# Patient Record
Sex: Female | Born: 1995 | Race: White | Hispanic: No | Marital: Married | Smoking: Never smoker
Health system: Southern US, Community
[De-identification: ages and names within clinical notes are randomized; demographics above are authoritative.]

## PROBLEM LIST (undated history)

## (undated) DIAGNOSIS — E079 Disorder of thyroid, unspecified: Secondary | ICD-10-CM

## (undated) DIAGNOSIS — F419 Anxiety disorder, unspecified: Secondary | ICD-10-CM

## (undated) DIAGNOSIS — M26639 Articular disc disorder of temporomandibular joint, unspecified side: Secondary | ICD-10-CM

## (undated) DIAGNOSIS — J343 Hypertrophy of nasal turbinates: Secondary | ICD-10-CM

## (undated) DIAGNOSIS — F32A Depression, unspecified: Secondary | ICD-10-CM

## (undated) DIAGNOSIS — F329 Major depressive disorder, single episode, unspecified: Secondary | ICD-10-CM

## (undated) DIAGNOSIS — T8859XA Other complications of anesthesia, initial encounter: Secondary | ICD-10-CM

## (undated) DIAGNOSIS — D6851 Activated protein C resistance: Secondary | ICD-10-CM

## (undated) DIAGNOSIS — K219 Gastro-esophageal reflux disease without esophagitis: Secondary | ICD-10-CM

## (undated) DIAGNOSIS — F909 Attention-deficit hyperactivity disorder, unspecified type: Secondary | ICD-10-CM

## (undated) HISTORY — DX: Depression, unspecified: F32.A

## (undated) HISTORY — DX: Anxiety disorder, unspecified: F41.9

## (undated) HISTORY — DX: Disorder of thyroid, unspecified: E07.9

## (undated) HISTORY — DX: Activated protein C resistance: D68.51

---
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
## 2015-07-27 DIAGNOSIS — F4322 Adjustment disorder with anxiety: Secondary | ICD-10-CM | POA: Insufficient documentation

## 2015-08-24 DIAGNOSIS — R41840 Attention and concentration deficit: Secondary | ICD-10-CM | POA: Insufficient documentation

## 2015-12-13 DIAGNOSIS — M26639 Articular disc disorder of temporomandibular joint, unspecified side: Secondary | ICD-10-CM

## 2015-12-13 HISTORY — DX: Articular disc disorder of temporomandibular joint, unspecified side: M26.639

## 2016-09-12 DIAGNOSIS — E038 Other specified hypothyroidism: Secondary | ICD-10-CM | POA: Insufficient documentation

## 2016-09-12 DIAGNOSIS — E039 Hypothyroidism, unspecified: Secondary | ICD-10-CM | POA: Insufficient documentation

## 2016-12-06 DIAGNOSIS — F419 Anxiety disorder, unspecified: Secondary | ICD-10-CM | POA: Insufficient documentation

## 2016-12-31 DIAGNOSIS — E559 Vitamin D deficiency, unspecified: Secondary | ICD-10-CM | POA: Insufficient documentation

## 2016-12-31 DIAGNOSIS — E568 Deficiency of other vitamins: Secondary | ICD-10-CM | POA: Insufficient documentation

## 2018-11-17 ENCOUNTER — Ambulatory Visit: Payer: Self-pay | Admitting: Family Medicine

## 2018-12-01 ENCOUNTER — Other Ambulatory Visit: Payer: Self-pay

## 2018-12-01 ENCOUNTER — Encounter: Payer: Self-pay | Admitting: Family Medicine

## 2018-12-01 ENCOUNTER — Ambulatory Visit: Payer: BC Managed Care – PPO | Admitting: Family Medicine

## 2018-12-01 VITALS — BP 112/62 | HR 86 | Temp 97.9°F | Resp 14 | Ht 68.0 in | Wt 210.4 lb

## 2018-12-01 DIAGNOSIS — Z130 Encounter for screening for diseases of the blood and blood-forming organs and certain disorders involving the immune mechanism: Secondary | ICD-10-CM

## 2018-12-01 DIAGNOSIS — F419 Anxiety disorder, unspecified: Secondary | ICD-10-CM

## 2018-12-01 DIAGNOSIS — E039 Hypothyroidism, unspecified: Secondary | ICD-10-CM | POA: Diagnosis not present

## 2018-12-01 DIAGNOSIS — Z1329 Encounter for screening for other suspected endocrine disorder: Secondary | ICD-10-CM

## 2018-12-01 DIAGNOSIS — F330 Major depressive disorder, recurrent, mild: Secondary | ICD-10-CM | POA: Insufficient documentation

## 2018-12-01 DIAGNOSIS — R5383 Other fatigue: Secondary | ICD-10-CM

## 2018-12-01 DIAGNOSIS — F331 Major depressive disorder, recurrent, moderate: Secondary | ICD-10-CM | POA: Diagnosis not present

## 2018-12-01 DIAGNOSIS — Z13228 Encounter for screening for other metabolic disorders: Secondary | ICD-10-CM

## 2018-12-01 DIAGNOSIS — Z7689 Persons encountering health services in other specified circumstances: Secondary | ICD-10-CM

## 2018-12-01 DIAGNOSIS — E669 Obesity, unspecified: Secondary | ICD-10-CM | POA: Insufficient documentation

## 2018-12-01 DIAGNOSIS — Z6831 Body mass index (BMI) 31.0-31.9, adult: Secondary | ICD-10-CM | POA: Insufficient documentation

## 2018-12-01 DIAGNOSIS — Z13220 Encounter for screening for lipoid disorders: Secondary | ICD-10-CM

## 2018-12-01 MED ORDER — BUSPIRONE HCL 5 MG PO TABS: 5.0000 mg | ORAL_TABLET | Freq: Two times a day (BID) | ORAL | 0 refills | Status: DC

## 2018-12-01 NOTE — Progress Notes (Deleted)
Name: Yvonne Little   MRN: [id number]    DOB: 1995-07-24   Date:12/01/2018       Progress Note  Subjective:    Chief Complaint  Chief Complaint  Patient presents with  . Establish Care    labs    I connected with  [name]  on 12/01/18 at 10:00 AM EDT by a video enabled telemedicine application and verified that I am speaking with the correct person using two identifiers.  I discussed the limitations of evaluation and management by telemedicine and the availability of in person appointments. The patient expressed understanding and agreed to proceed. Staff also discussed with the patient that there may be a patient responsible charge related to this service. Patient Location: *** Provider Location: *** Additional Individuals present: ***  HPI    There are no active problems to display for this patient.   History reviewed. No pertinent surgical history.  Family History  Problem Relation Age of Onset  . Hyperlipidemia Mother   . Hypertension Maternal Grandmother   . Hyperlipidemia Maternal Grandmother   . Hypertension Maternal Grandfather   . Hyperlipidemia Maternal Grandfather   . Stroke Maternal Grandfather   . Diabetes Paternal Grandmother   . Non-Hodgkin's lymphoma Paternal Grandmother   . Melanoma Paternal Grandfather     Social History   Socioeconomic History  . Marital status: Significant Other    Spouse name: Not on file  . Number of children: Not on file  . Years of education: 69  . Highest education level: Bachelor's degree (e.g., BA, AB, BS)  Occupational History  . Not on file  Social Needs  . Financial resource strain: Not hard at all  . Food insecurity    Worry: Never true    Inability: Never true  . Transportation needs    Medical: No    Non-medical: No  Tobacco Use  . Smoking status: Never Smoker  . Smokeless tobacco: Never Used  Substance and Sexual Activity  . Alcohol use: Yes  . Drug use: Never  . Sexual activity: Yes   Lifestyle  . Physical activity    Days per week: 0 days    Minutes per session: 0 min  . Stress: Very much  Relationships  . Social connections    Talks on phone: More than three times a week    Gets together: Once a week    Attends religious service: 1 to 4 times per year    Active member of club or organization: No    Attends meetings of clubs or organizations: Never    Relationship status: Not on file  . Intimate partner violence    Fear of current or ex partner: No    Emotionally abused: No    Physically abused: No    Forced sexual activity: No  Other Topics Concern  . Not on file  Social History Narrative  . Not on file     Current Outpatient Medications:  .  clonazePAM (KLONOPIN) 0.5 MG tablet, Take 0.5 mg by mouth as needed., Disp: , Rfl:  .  PARoxetine (PAXIL) 20 MG tablet, Take 20 mg by mouth daily., Disp: , Rfl:  .  phentermine (ADIPEX-P) 37.5 MG tablet, Take 37.5 mg by mouth daily., Disp: , Rfl:   Allergies  Allergen Reactions  . Escitalopram Other (See Comments)    Altered mental status.     I personally reviewed {Reviewed:14835} with the patient/caregiver today.  Review of Systems    Objective:  Virtual encounter, vitals limited, only able to obtain the following Today's Vitals   12/01/18 1006  BP: 112/62  Pulse: 86  Resp: 14  Temp: 97.9 F (36.6 C)  SpO2: 95%  Weight: 210 lb 6.4 oz (95.4 kg)  Height: 5\' 8"  (1.727 m)   Body mass index is 31.99 kg/m. Nursing Note and Vital Signs reviewed.  Physical Exam  PE limited by telephone encounter  No results found for this or any previous visit (from the past 72 hour(s)).  PHQ2/9: Depression screen PHQ 2/9 12/01/2018  Decreased Interest 0  Down, Depressed, Hopeless 1  PHQ - 2 Score 1  Altered sleeping 3  Tired, decreased energy 2  Change in appetite 1  Feeling bad or failure about yourself  3  Trouble concentrating 3  Moving slowly or fidgety/restless 0  Suicidal thoughts 0  PHQ-9  Score 13  Difficult doing work/chores Somewhat difficult   PHQ-2/9 Result is {gen negative/positive:315881}.    Fall Risk: Fall Risk  12/01/2018  Falls in the past year? 1  Number falls in past yr: 1  Injury with Fall? 1     Assessment and Plan:   Problem List Items Addressed This Visit    None       I discussed the assessment and treatment plan with the patient. The patient was provided an opportunity to ask questions and all were answered. The patient agreed with the plan and demonstrated an understanding of the instructions.  The patient was advised to call back or seek an in-person evaluation if the symptoms worsen or if the condition fails to improve as anticipated.  I provided *** minutes of non-face-to-face time during this encounter.  01/31/2019, PA-C 11/30/2008:34 AM

## 2018-12-01 NOTE — Progress Notes (Signed)
Name: Yvonne Little   MRN: [id number]    DOB: 1995/11/01   Date:12/01/2018       Progress Note  Chief Complaint  Patient presents with  . Establish Care    labs     Subjective:   Yvonne Little is a 23 y.o. female, presents to clinic for routine follow up on the conditions listed above.   Patient is a 23 year old female, she presents here with her mother, is new to establish care, recently moved here from [place] this past summer.  She was previously seeing Benjamine Sprague - [facility], [facility] -lasted a visit in September to get refills on her medications and has not had labs in a while.  Some records are available through care everywhere  Patient is primarily concerned with her mood, anxiety, depression, weight, history of hypothyroid and also reports history of ADHD.   Weight/Obesity BMI 31.99 -  she has been working on her diet and exercising and even though she hasn't lost weight recently her clothes are fitting better.  Her visit with her past PCP in September she did get phentermine prescribed to her but she has not yet filled it.  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  She did not have any side effects in the past with phentermine.  Her past PCP told her to start at 37.5 mg dose daily.  Patient states it also did help her focus little bit better with her ADHD while she is teaching.  She did not experience any adverse side effects in the past, she denies any chest pain, palpitations, dry mouth, elevated blood pressure, headaches, dry mouth. Goal weight is 170.    Anxiety and Depression: Patient is currently on Paxil 20 mg and has Klonopin for as needed use for panic attacks-  She states she does have a history of depression and anxiety She  reports passive SI in the past but denies any suicidal attempts or self-harm behavior in the past or presently.  She currently denies HI, SI and AVH.  She is having worse symptoms then which she normally have when she is on Paxil. She stopped medications for a little bit during the summer and she felt good but with increased stress with teaching she does need the medications again.  They do not seem like they are working as well as they used to in the past.  Her stress and anxiety is not only triggered by her demands responsibilities as a Pharmacist, hospital but when she started going to college or other job that she held she always has her depression anxiety triggered by any responsibilities.   She does have associated Insomnia - trouble getting to sleep at night -on a weekday or nights before school days she is getting 5 hours of sleep is great for her typically will get a little bit less, this makes it difficult to get through the day causing her worse attention, more anxious, and she does tend to catch up on sleep on the weekends sleeping late into the afternoons   Paxil 20 feels like its not working that well, 40 mg in the past was too sedating Lexapro - "severe reaction to that" was "out of it for a month" couldn't hardly get out of  bed -she was just told that it was a medication allergy and that was all that she was told she has not tried any other SSRIs or SNRIs When discussing her Klonopin medication she states that her family has history of addiction on both sides of the family, and she has a very "addictive personality" and she is very nervous about things that she could become addicted to easily or abuse easily.  She does not currently have any substance use or alcohol abuse.   Her PHQ and gad 7 are very high today we discussed this briefly, her mother was in the room the entire time and did not step out  Depression screen [facility] 2/9 12/01/2018  Decreased Interest 0  Down, Depressed, Hopeless 1  PHQ - 2  Score 1  Altered sleeping 3  Tired, decreased energy 2  Change in appetite 1  Feeling bad or failure about yourself  3  Trouble concentrating 3  Moving slowly or fidgety/restless 0  Suicidal thoughts 0  PHQ-9 Score 13  Difficult doing work/chores Somewhat difficult   PHQ9 and GAD7 positive and reviewed today with pt   GAD 7 : Generalized Anxiety Score 12/01/2018  Nervous, Anxious, on Edge 2  Control/stop worrying 2  Worry too much - different things 2  Trouble relaxing 3  Restless 1  Easily annoyed or irritable 3  Afraid - awful might happen 2  Total GAD 7 Score 15  Anxiety Difficulty Not difficult at all   Hx of hypothyroid in the past "for 6 months", was on meds but the patient says that it "fixed itself" she would like her thyroid levels checked again.  She is much more fatigue with worsening mood, increasing weight Current symptoms: change in energy level, heat / cold intolerance and weight changes . Patient denies palpitations. Symptoms have waxed and waned.  Some intermittent bowel changes, sometimes constipated sometimes loser.   Patient Active Problem List   Diagnosis Date Noted  . Mild episode of recurrent major depressive disorder (HCC) 12/01/2018  . Anxiety disorder 12/01/2018   History reviewed. No pertinent surgical history.  Family History  Problem Relation Age of Onset  . Hyperlipidemia Mother   . Hypertension Maternal Grandmother   . Hyperlipidemia Maternal Grandmother   . Hypertension Maternal Grandfather   . Hyperlipidemia Maternal Grandfather   . Stroke Maternal Grandfather   . Diabetes Paternal Grandmother   . Non-Hodgkin's lymphoma Paternal Grandmother   . Melanoma Paternal Grandfather     Social History   Socioeconomic History  . Marital status: Significant Other    Spouse name: Not on file  . Number of children: Not on file  . Years of education: 64  . Highest education level: Bachelor's degree (e.g., BA, AB, BS)  Occupational History  .  Not on file  Social Needs  . Financial resource strain: Not hard at all  . Food insecurity    Worry: Never true    Inability: Never true  . Transportation needs    Medical: No    Non-medical: No  Tobacco Use  . Smoking status: Never Smoker  . Smokeless tobacco: Never Used  Substance and Sexual Activity  . Alcohol use: Yes  . Drug use: Never  . Sexual activity: Yes  Lifestyle  . Physical activity    Days per week: 0 days    Minutes per session: 0 min  . Stress: Very much  Relationships  . Social connections    Talks on phone: More than three times  a week    Gets together: Once a week    Attends religious service: 1 to 4 times per year    Active member of club or organization: No    Attends meetings of clubs or organizations: Never    Relationship status: Not on file  . Intimate partner violence    Fear of current or ex partner: No    Emotionally abused: No    Physically abused: No    Forced sexual activity: No  Other Topics Concern  . Not on file  Social History Narrative  . Not on file     Current Outpatient Medications:  .  clonazePAM (KLONOPIN) 0.5 MG tablet, Take 0.5 mg by mouth as needed., Disp: , Rfl:  .  PARoxetine (PAXIL) 20 MG tablet, Take 20 mg by mouth daily., Disp: , Rfl:  .  phentermine (ADIPEX-P) 37.5 MG tablet, Take 37.5 mg by mouth daily., Disp: , Rfl:  .  busPIRone (BUSPAR) 5 MG tablet, Take 1 tablet (5 mg total) by mouth 2 (two) times daily., Disp: 60 tablet, Rfl: 0  Allergies  Allergen Reactions  . Escitalopram Other (See Comments)    Altered mental status.    I personally reviewed active problem list, medication list, allergies, family history, social history, health maintenance, notes from last encounter, lab results with the patient/caregiver today.  Review of Systems  Constitutional: Positive for fatigue. Negative for activity change, appetite change, chills, diaphoresis and fever.  HENT: Negative.   Eyes: Negative.   Respiratory:  Negative.  Negative for shortness of breath.   Cardiovascular: Negative.  Negative for chest pain, palpitations and leg swelling.  Gastrointestinal: Negative.  Negative for abdominal pain and blood in stool.  Endocrine: Negative.   Genitourinary: Negative.   Musculoskeletal: Negative.  Negative for arthralgias, gait problem, joint swelling and myalgias.  Skin: Negative.  Negative for color change, pallor and rash.  Allergic/Immunologic: Negative.   Neurological: Negative.  Negative for dizziness, syncope, weakness, light-headedness, numbness and headaches.  Hematological: Negative.   Psychiatric/Behavioral: Positive for agitation, decreased concentration, dysphoric mood and sleep disturbance. Negative for behavioral problems, confusion, self-injury and suicidal ideas. The patient is nervous/anxious.      Objective:    Vitals:   12/01/18 1006  BP: 112/62  Pulse: 86  Resp: 14  Temp: 97.9 F (36.6 C)  SpO2: 95%  Weight: 210 lb 6.4 oz (95.4 kg)  Height: 5\' 8"  (1.727 m)    Body mass index is 31.99 kg/m.  Physical Exam Constitutional:      General: She is not in acute distress.    Appearance: Normal appearance. She is well-developed. She is not toxic-appearing or diaphoretic.  HENT:     Head: Normocephalic and atraumatic.     Right Ear: External ear normal.     Left Ear: External ear normal.     Nose: Nose normal.     Mouth/Throat:     Pharynx: Uvula midline.  Eyes:     General: Lids are normal.     Conjunctiva/sclera: Conjunctivae normal.     Pupils: Pupils are equal, round, and reactive to light.  Neck:     Musculoskeletal: Normal range of motion and neck supple.     Trachea: Phonation normal. No tracheal deviation.  Cardiovascular:     Rate and Rhythm: Normal rate and regular rhythm.     Pulses: Normal pulses.          Radial pulses are 2+ on the right side and 2+ on the  left side.       Posterior tibial pulses are 2+ on the right side and 2+ on the left side.      Heart sounds: Normal heart sounds. No murmur. No friction rub. No gallop.   Pulmonary:     Effort: Pulmonary effort is normal. No respiratory distress.     Breath sounds: Normal breath sounds. No stridor. No wheezing, rhonchi or rales.  Chest:     Chest wall: No tenderness.  Abdominal:     General: Bowel sounds are normal. There is no distension.     Palpations: Abdomen is soft.     Tenderness: There is no abdominal tenderness. There is no guarding or rebound.  Musculoskeletal: Normal range of motion.        General: No deformity.  Lymphadenopathy:     Cervical: No cervical adenopathy.  Skin:    General: Skin is warm and dry.     Capillary Refill: Capillary refill takes less than 2 seconds.     Coloration: Skin is not pale.     Findings: No rash.  Neurological:     Mental Status: She is alert and oriented to person, place, and time.     Motor: No abnormal muscle tone.     Gait: Gait normal.  Psychiatric:        Speech: Speech normal.        Behavior: Behavior normal.      Fall Risk: Fall Risk  12/01/2018  Falls in the past year? 1  Number falls in past yr: 1  Injury with Fall? 1    Functional Status Survey: Is the patient deaf or have difficulty hearing?: No Does the patient have difficulty seeing, even when wearing glasses/contacts?: Yes Does the patient have difficulty concentrating, remembering, or making decisions?: No Does the patient have difficulty walking or climbing stairs?: No Does the patient have difficulty dressing or bathing?: No Does the patient have difficulty doing errands alone such as visiting a doctor's office or shopping?: No  Assessment & Plan:   New pt to est care here today, Suzanna ObeyGabrielle Winters is a 23 y.o. caucasian female, otherwise healthy with med hx of obesity, anxiety & depression.    ICD-10-CM   1. Moderate episode of recurrent major depressive disorder (HCC)  F33.1 PARoxetine (PAXIL) 20 MG tablet    busPIRone (BUSPAR) 5 MG tablet   very  sx on current med managment.  Add Buspar and if no improvement would cross taper - therapy/counseling would be helpful  2. Anxiety disorder, unspecified type  F41.9 clonazePAM (KLONOPIN) 0.5 MG tablet    PARoxetine (PAXIL) 20 MG tablet    busPIRone (BUSPAR) 5 MG tablet   see above, discussed avoiding controlled substances/benzos with her concerns of family hx of addiction, discussed sparingly use for severe sx or with med chgs   3. Fatigue, unspecified type  R53.83 Hemoglobin A1c    TSH    CBC with Differential/Platelet    T4, free   r/o hypothyroid, anemia, suspect mostly related to psychiatric conditions and sx  4. Hypothyroidism, unspecified type  E03.9 TSH    T4, free   unclear hx, labs today  5. Class 1 obesity with body mass index (BMI) of 31.0 to 31.9 in adult, unspecified obesity type, unspecified whether serious comorbidity present  E66.9 phentermine (ADIPEX-P) 37.5 MG tablet   Z68.31 COMPLETE METABOLIC PANEL WITH GFR    Lipid panel    Hemoglobin A1c    TSH   discussed healthy diet  and exercising to be healthier and feel better.  discussed at length today, handout given  6. Screening for lipoid disorders  Z13.220 Lipid panel  7. Screening for endocrine, metabolic and immunity disorder  Z13.29 COMPLETE METABOLIC PANEL WITH GFR   Z13.228 Lipid panel   Z13.0 Hemoglobin A1c    TSH    T4, free  8. Screening for deficiency anemia  Z13.0 CBC with Differential/Platelet  9. Encounter to establish care with new doctor  [phone]    records available through care everywhere for last PCP in [place]     Return in about 1 month (around 01/01/2019) for f/up med check phentermine and mood/depression/anxiety meds (virtual ok).   [name], PA-C 12/01/18 12:39 PM

## 2018-12-02 LAB — HEMOGLOBIN A1C
Hgb A1c MFr Bld: 5.1 % of total Hgb (ref <5.7)
Mean Plasma Glucose: 100 (calc)
eAG (mmol/L): 5.5 (calc)

## 2018-12-02 LAB — COMPLETE METABOLIC PANEL WITH GFR
AG Ratio: 1.8 (calc) (ref 1.0–2.5)
ALT: 11 U/L (ref 6–29)
AST: 15 U/L (ref 10–30)
Albumin: 4.3 g/dL (ref 3.6–5.1)
Alkaline phosphatase (APISO): 68 U/L (ref 31–125)
BUN: 11 mg/dL (ref 7–25)
CO2: 24 mmol/L (ref 20–32)
Calcium: 9.2 mg/dL (ref 8.6–10.2)
Chloride: 106 mmol/L (ref 98–110)
Creat: 0.8 mg/dL (ref 0.50–1.10)
GFR, Est African American: 120 mL/min/{1.73_m2} (ref >=60)
GFR, Est Non African American: 103 mL/min/{1.73_m2} (ref >=60)
Globulin: 2.4 g/dL (calc) (ref 1.9–3.7)
Glucose, Bld: 99 mg/dL (ref 65–99)
Potassium: 4.6 mmol/L (ref 3.5–5.3)
Sodium: 140 mmol/L (ref 135–146)
Total Bilirubin: 0.3 mg/dL (ref 0.2–1.2)
Total Protein: 6.7 g/dL (ref 6.1–8.1)

## 2018-12-02 LAB — CBC WITH DIFFERENTIAL/PLATELET
Absolute Monocytes: 488 cells/uL (ref 200–950)
Basophils Absolute: 59 cells/uL (ref 0–200)
Basophils Relative: 0.9 %
Eosinophils Absolute: 338 cells/uL (ref 15–500)
Eosinophils Relative: 5.2 %
HCT: 38.3 % (ref 35.0–45.0)
Hemoglobin: 12.8 g/dL (ref 11.7–15.5)
Lymphs Abs: 2535 cells/uL (ref 850–3900)
MCH: 30.6 pg (ref 27.0–33.0)
MCHC: 33.4 g/dL (ref 32.0–36.0)
MCV: 91.6 fL (ref 80.0–100.0)
MPV: 9.9 fL (ref 7.5–12.5)
Monocytes Relative: 7.5 %
Neutro Abs: 3081 cells/uL (ref 1500–7800)
Neutrophils Relative %: 47.4 %
Platelets: 250 10*3/uL (ref 140–400)
RBC: 4.18 10*6/uL (ref 3.80–5.10)
RDW: 13.1 % (ref 11.0–15.0)
Total Lymphocyte: 39 %
WBC: 6.5 10*3/uL (ref 3.8–10.8)

## 2018-12-02 LAB — LIPID PANEL
Cholesterol: 155 mg/dL (ref <200)
HDL: 44 mg/dL — ABNORMAL LOW (ref >=50)
LDL Cholesterol (Calc): 98 mg/dL (calc)
Non-HDL Cholesterol (Calc): 111 mg/dL (calc) (ref <130)
Total CHOL/HDL Ratio: 3.5 (calc) (ref <5.0)
Triglycerides: 51 mg/dL (ref <150)

## 2018-12-02 LAB — TSH: TSH: 6.29 mIU/L — ABNORMAL HIGH

## 2018-12-02 LAB — T4, FREE: Free T4: 0.9 ng/dL (ref 0.8–1.8)

## 2018-12-02 MED ORDER — LEVOTHYROXINE SODIUM 25 MCG PO TABS: ORAL_TABLET | ORAL | 0 refills | Status: DC

## 2018-12-02 NOTE — Addendum Note (Signed)
Addended by: Delsa Grana on: 12/02/2018 05:05 PM   Modules accepted: Orders

## 2018-12-16 ENCOUNTER — Other Ambulatory Visit: Payer: Self-pay | Admitting: Family Medicine

## 2018-12-16 NOTE — Telephone Encounter (Signed)
Can you all and see what dose the pt was on and tolerating so I can call in the refill med at that dose?  75 mcg? I may call in 75 or 100 mcg depending on how she was feeling.  thanks

## 2018-12-16 NOTE — Telephone Encounter (Signed)
Refill request for thyroid medication. Levothyroxine   Last visit 12/01/2018   Lab Results  Component Value Date   TSH 6.29 (H) 12/01/2018     Follow up on 01/02/2019

## 2018-12-16 NOTE — Telephone Encounter (Signed)
Left voice mail

## 2018-12-16 NOTE — Telephone Encounter (Signed)
Pt states doing good is supposed to start 75mg  tomorrow

## 2018-12-17 NOTE — Telephone Encounter (Signed)
Left detailed voicemail

## 2018-12-19 ENCOUNTER — Telehealth: Payer: Self-pay

## 2018-12-19 NOTE — Telephone Encounter (Signed)
Pt states is constantly hunger, fatigue, sleeping all the time she states its worse with the meds?  Please advise

## 2018-12-19 NOTE — Telephone Encounter (Signed)
Copied from [place] [phone]. Topic: General - Other >> Dec 18, 2018  4:05 PM Pauline Good wrote: Reason for CRM: pt need to speak to nurse because she feel like her thyroid medication isn't working. Please call pt

## 2018-12-22 NOTE — Telephone Encounter (Signed)
lvm for scheduling °

## 2018-12-24 ENCOUNTER — Other Ambulatory Visit: Payer: Self-pay

## 2018-12-24 NOTE — Telephone Encounter (Signed)
On 75mg  now

## 2018-12-25 ENCOUNTER — Other Ambulatory Visit: Payer: Self-pay

## 2018-12-25 ENCOUNTER — Encounter: Payer: Self-pay | Admitting: Family Medicine

## 2018-12-25 ENCOUNTER — Ambulatory Visit: Payer: BC Managed Care – PPO | Admitting: Family Medicine

## 2018-12-25 VITALS — BP 114/80 | HR 97 | Temp 97.5°F | Resp 14 | Wt 211.0 lb

## 2018-12-25 DIAGNOSIS — E039 Hypothyroidism, unspecified: Secondary | ICD-10-CM | POA: Diagnosis not present

## 2018-12-25 DIAGNOSIS — F419 Anxiety disorder, unspecified: Secondary | ICD-10-CM

## 2018-12-25 DIAGNOSIS — E669 Obesity, unspecified: Secondary | ICD-10-CM | POA: Diagnosis not present

## 2018-12-25 DIAGNOSIS — F330 Major depressive disorder, recurrent, mild: Secondary | ICD-10-CM | POA: Diagnosis not present

## 2018-12-25 DIAGNOSIS — G4700 Insomnia, unspecified: Secondary | ICD-10-CM

## 2018-12-25 DIAGNOSIS — Z6831 Body mass index (BMI) 31.0-31.9, adult: Secondary | ICD-10-CM

## 2018-12-25 MED ORDER — LEVOTHYROXINE SODIUM 100 MCG PO TABS: ORAL_TABLET | ORAL | 0 refills | Status: DC

## 2018-12-25 MED ORDER — ZOLPIDEM TARTRATE 5 MG PO TABS: 5.0000 mg | ORAL_TABLET | Freq: Every evening | ORAL | 1 refills | Status: DC | PRN

## 2018-12-25 NOTE — Patient Instructions (Signed)
Sleep Hygiene Tips  1) Get regular. One of the best ways to train your body to sleep well is to go to bed and get up at more or less the same time every day, even on weekends and days off! This regular rhythm will make you feel better and will give your body something to work from.  2) Sleep when sleepy. Only try to sleep when you actually feel tired or sleepy, rather than spending too much time awake in bed.  3) Get up & try again. If you haven't been able to get to sleep after about 20 minutes or more, get up and do something calming or boring until you feel sleepy, then return to bed and try again. Sit quietly on the couch with the lights off (bright light will tell your brain that it is time to wake up), or read something boring like the phone book. Avoid doing anything that is too stimulating or interesting, as this will wake you up even more.  4) Avoid caffeine & nicotine. It is best to avoid consuming any caffeine (in coffee, tea, cola drinks, chocolate, and some medications) or nicotine (cigarettes) for at least 4-6 hours before going to bed. These substances act as stimulants and interfere with the ability to fall asleep  5) Avoid alcohol. It is also best to avoid alcohol for at least 4-6 hours before going to bed. Many people believe that alcohol is relaxing and helps them to get to sleep at first, but it actually interrupts the quality of Sleep.  6) Bed is for sleeping. Try not to use your bed for anything other than sleeping and sex, so that your body comes to associate bed with sleep. If you use bed as a place to watch TV, eat, read, work on your laptop, pay bills, and other things, your body will not learn this Connection.  7) No naps. It is best to avoid taking naps during the day, to make sure that you are tired at bedtime. If you can't make it through the day without a nap, make sure it is for less than an hour and before 3pm.  8) Sleep rituals.  You can develop your own rituals of things to remind your body that it is time to sleep - some people find it useful to do relaxing stretches or breathing exercises for 15 minutes before bed each night, or sit calmly with a cup of caffeine-free tea.  9) Bathtime. Having a hot bath 1-2 hours before bedtime can be useful, as it will raise your body temperature, causing you to feel sleepy as your body temperature drops again. Research shows that sleepiness is associated with a drop in body temperature.  10) No clock-watching. Many people who struggle with sleep tend to watch the clock too much. Frequently checking the clock during the night can wake you up (especially if you turn on the light to read the time) and reinforces negative thoughts such as "Oh no, look how late it is, I'll never get to sleep" or "it's so early, I have only slept for 5 hours, this is Terrible.  11) Use a sleep diary. This worksheet can be a useful way of making sure you have the right facts about your sleep, rather than making assumptions. Because a diary involves watching the clock (see point 10) it is a good idea to only use it for two weeks to get an idea of what is going and then perhaps two months down the  track to see how you are progressing.  12) Exercise. Regular exercise is a good idea to help with good sleep, but try not to do strenuous exercise in the 4 hours before bedtime. Morning walks are a great way to start the day feeling refreshed!  13) Eat right. A healthy, balanced diet will help you to sleep well, but timing is important. Some people find that a very empty stomach at bedtime is distracting, so it can be useful to have a light snack, but a heavy meal soon before bed can also interrupt sleep. Some people recommend a warm glass of milk, which contains tryptophan, which acts as a natural sleep inducer.  14) The right space. It is very important that your bed and bedroom are quiet  and comfortable for sleeping. A cooler room with enough blankets to stay warm is best, and make sure you have curtains or an eyemask to block out early morning light and earplugs if there is noise outside your Room.  15) Keep daytime routine the same. Even if you have a bad night sleep and are tired it is important that you try to keep your daytime activities the same as you had planned. That is, don't avoid activities because you feel tired. This can reinforce the insomnia.

## 2018-12-25 NOTE — Progress Notes (Signed)
Name: Yvonne Little   MRN: [id number]    DOB: December 18, 1995   Date:12/25/2018       Progress Note  Chief Complaint  Patient presents with   Follow-up    1 month f/u   Hypothyroidism    does not think is working.  Has been on the for 1 weeks now, still feeling naueous, weight gain, and fatigue     Subjective:   Yvonne Little is a 23 y.o. female, presents to clinic for routine follow up on hypothyroid, fatigue, anxiety and depression-recently established care here after moving  Subclinical hypothyroid -patient had been diagnosed with hypothyroid about 2 to 3 years ago and has been on and off medications but prior to coming here few weeks ago she was feeling similar symptoms to when she was hypothyroid before and did desire to restart medications.  We did check her TSH and it was elevated, T4 was normal.  Because she is had this before and tolerated levothyroxine well in the past we did decide to restart medication starting 25 mcg and gradually increasing every week.  She is currently at a 75 mcg dose.  She feels that her appetite is up, she feels like she is gaining weight she feels more fatigued -the symptoms started to be worse shortly after beginning the medications again.  She denies any palpitations, racing heart, sweats. Reviewing her weight has been fairly stable Wt Readings from Last 5 Encounters:  12/25/18 211 lb (95.7 kg)  12/01/18 210 lb 6.4 oz (95.4 kg)   She feels like her moods her depression and anxiety are overall feeling better with her Zoloft but her energy her sleeping patterns her fatigue is much worse.  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  She is only waking up when she  has to for work and then starts the cycle all over again.  On the weekends she is sleeping in all day sometimes late into the afternoon and still feels tired.  In the evening she is mostly home alone she watch TV or do stuff around her house.  When she does try and go to bed she is not feeling anxious or having racing thoughts or anxiety symptoms preventing her from falling asleep.   We reviewed her depression and anxiety screening today during the visit  Depression screen [facility] 2/9 12/25/2018 12/01/2018  Decreased Interest 2 0  Down, Depressed, Hopeless 1 1  PHQ - 2 Score 3 1  Altered sleeping 3 3  Tired, decreased energy 3 2  Change in appetite 3 1  Feeling bad or failure about yourself  0 3  Trouble concentrating 3 3  Moving slowly or fidgety/restless 3 0  Suicidal thoughts 0 0  PHQ-9 Score 18 13  Difficult doing work/chores Extremely dIfficult Somewhat difficult  Overall her depressive symptoms have improved but her fatigue, altered sleeping appetite and energy are worse.  She has no suicidal thoughts and her depressive thoughts and feeling bad about herself or that she is a failure she does not have those right now. She states that she has been taking Paxil for about a year and she has taken it at night and sometimes  in the morning she does not notice a difference in it making her sleepy.  She was prescribed Klonopin by her last PCP but she has not been taking it and she does not like taking it.  GAD 7 : Generalized Anxiety Score 12/25/2018 12/01/2018  Nervous, Anxious, on Edge 1 2  Control/stop worrying 1 2  Worry too much - different things 1 2  Trouble relaxing 1 3  Restless 1 1  Easily annoyed or irritable 1 3  Afraid - awful might happen 1 2  Total GAD 7 Score 7 15  Anxiety Difficulty Not difficult at all Not difficult at all  We reviewed her anxiety symptoms they are much better than a few weeks ago.  She is concerned about her weight -since starting her levothyroxine again she  decided to hold the phentermine and has not taken it she is concerned that she feels hungrier and feels that her clothes are tighter but she has only 1 pound weight change since several weeks ago.    HPI from December 01, 2018 when she first came here to establish care Weight/Obesity BMI 31.99 -  she has been working on her diet and exercising and even though she hasn't lost weight recently her clothes are fitting better.  Her visit with her past PCP in September she did get phentermine prescribed to her but she has not yet filled it.  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  She did not have any side effects in the past with phentermine.  Her past PCP told her to start at 37.5 mg dose daily.  Patient states it also did help her focus little bit better with her ADHD while she is teaching.  She did not experience any adverse side effects in the past, she denies any chest pain, palpitations, dry mouth, elevated blood pressure, headaches, dry mouth. Goal weight is 170.    Anxiety and Depression: Patient is currently on Paxil 20 mg and has Klonopin for as needed use for panic attacks-  She states she does have a history of depression and anxiety She reports passive SI in the past but denies any suicidal attempts or self-harm behavior in the past or presently.  She currently denies HI, SI and AVH.  She is having worse symptoms then which she normally have when she is on Paxil. She stopped medications for a little bit during the summer and she felt good but with increased stress with teaching she does need the medications again.  They do not seem like they are working as well as they used to in the past.  Her stress and anxiety is not only triggered by her demands responsibilities as a Runner, broadcasting/film/video but when  she started going to college or other job that she held she always has her depression anxiety triggered by any responsibilities.   She does have associated Insomnia - trouble getting to sleep at night -on a weekday or nights before school days she is getting 5 hours of sleep is great for her typically will get a little bit less, this makes it difficult to get through the day causing her worse attention, more anxious, and she does tend to catch up on sleep on the weekends sleeping late into the afternoons  Paxil 20 feels like its not working that well, 40 mg in the past was too sedating Lexapro - "severe reaction to that" was "out of it for a month" couldn't hardly get out of bed -she was just told that it was a medication allergy and that was all that she was told she has not tried any other SSRIs or SNRIs When discussing her Klonopin medication she states that her family has history of addiction on both sides of the family, and she has a very "addictive personality" and she is very nervous about things that she could become addicted to easily or abuse easily.  She does not currently have any substance use or alcohol abuse.    Hx of hypothyroid in the past "for 6 months", was on meds but the patient says that it "fixed itself" she would like her thyroid levels checked again.  She is much more fatigue with worsening mood, increasing weight Current symptoms: change in energy level, heat / cold intolerance and weight changes . Patient denies palpitations. Symptoms have waxed and waned.  Some intermittent bowel changes, sometimes constipated sometimes loser.   Patient Active Problem List   Diagnosis Date Noted   Hypothyroidism 12/25/2018   Mild episode of recurrent major depressive disorder (HCC) 12/01/2018   Anxiety disorder 12/01/2018   Class 1 obesity with body mass index (BMI) of 31.0 to 31.9 in adult 12/01/2018   History reviewed. No pertinent surgical history.  Family History  Problem  Relation Age of Onset   Hyperlipidemia Mother    Hypertension Maternal Grandmother    Hyperlipidemia Maternal Grandmother    Hypertension Maternal Grandfather    Hyperlipidemia Maternal Grandfather    Stroke Maternal Grandfather    Diabetes Paternal Grandmother    Non-Hodgkin's lymphoma Paternal Grandmother    Melanoma Paternal Grandfather     Social History   Socioeconomic History   Marital status: Significant Other    Spouse name: Not on file   Number of children: Not on file   Years of education: 12   Highest education level: Bachelor's degree (e.g., BA, AB, BS)  Occupational History   Not on file  Social Needs   Financial resource strain: Not hard at all   Food insecurity    Worry: Never true    Inability: Never true   Transportation needs    Medical: No    Non-medical: No  Tobacco Use   Smoking status: Never Smoker   Smokeless tobacco: Never Used  Substance and Sexual Activity   Alcohol use: Yes   Drug use: Never   Sexual activity: Yes  Lifestyle   Physical activity    Days per week: 0 days    Minutes per session: 0 min   Stress: Very much  Relationships   Social connections    Talks on phone: More than three times a week    Gets together: Once a week    Attends religious service: 1 to 4 times per year    Active member of club or organization: No    Attends meetings of clubs or organizations: Never    Relationship status: Not on file   Intimate partner violence    Fear of current or ex partner: No    Emotionally abused: No    Physically abused: No    Forced sexual activity: No  Other Topics Concern   Not on file  Social History Narrative   Not on file     Current Outpatient Medications:  PARoxetine (PAXIL) 20 MG tablet, Take 20 mg by mouth at bedtime. , Disp: , Rfl:    busPIRone (BUSPAR) 5 MG tablet, Take 1 tablet (5 mg total) by mouth 2 (two) times daily. (Patient not taking: Reported on 12/25/2018), Disp: 60  tablet, Rfl: 0   levothyroxine (SYNTHROID) 100 MCG tablet, Take 1 tablet po qam before breakfast, then take 1.5 tablets (150 mcg) po qam before breakfast daily, Disp: 60 tablet, Rfl: 0  Allergies  Allergen Reactions   Escitalopram Other (See Comments)    Altered mental status.    I have personally reviewed her labs, medication list, allergies, past medical history today with the patient  Review of Systems  Constitutional: Positive for fatigue. Negative for chills, diaphoresis and fever.  HENT: Negative.   Eyes: Negative.   Respiratory: Negative.   Cardiovascular: Negative.   Gastrointestinal: Negative.   Endocrine: Negative.   Genitourinary: Negative.   Musculoskeletal: Negative.  Negative for arthralgias and joint swelling.  Skin: Negative.   Allergic/Immunologic: Negative.   Neurological: Negative.   Hematological: Negative.   Psychiatric/Behavioral: Positive for sleep disturbance. Negative for agitation, dysphoric mood, hallucinations, self-injury and suicidal ideas. The patient is not hyperactive.   All other systems reviewed and are negative.    Objective:    Vitals:   12/25/18 1453  BP: 114/80  Pulse: 97  Resp: 14  Temp: (!) 97.5 F (36.4 C)  SpO2: 98%  Weight: 211 lb (95.7 kg)    Body mass index is 32.08 kg/m.  Physical Exam Vitals signs and nursing note reviewed.  Constitutional:      General: She is not in acute distress.    Appearance: She is well-developed. She is not ill-appearing, toxic-appearing or diaphoretic.  HENT:     Head: Normocephalic and atraumatic.     Nose: Nose normal.  Eyes:     General:        Right eye: No discharge.        Left eye: No discharge.     Conjunctiva/sclera: Conjunctivae normal.  Neck:     Trachea: No tracheal deviation.  Cardiovascular:     Rate and Rhythm: Normal rate and regular rhythm.  Pulmonary:     Effort: Pulmonary effort is normal. No respiratory distress.     Breath sounds: No stridor.    Musculoskeletal: Normal range of motion.  Skin:    General: Skin is warm and dry.     Findings: No rash.  Neurological:     Mental Status: She is alert.     Motor: No abnormal muscle tone.     Coordination: Coordination normal.  Psychiatric:        Mood and Affect: Mood normal.        Behavior: Behavior normal.      Fall Risk: Fall Risk  12/25/2018 12/01/2018  Falls in the past year? 0 1  Number falls in past yr: 0 1  Injury with Fall? 0 1    Functional Status Survey: Is the patient deaf or have difficulty hearing?: No Does the patient have difficulty seeing, even when wearing glasses/contacts?: No Does the patient have difficulty concentrating, remembering, or making decisions?: No Does the patient have difficulty walking or climbing stairs?: No Does the patient have difficulty dressing or bathing?: No Does the patient have difficulty doing errands alone such as visiting a doctor's office or shopping?: No  Assessment & Plan:   Pt here to f/up on levothyroxine medicine, restarted and titrating dose up  ICD-10-CM   1. Hypothyroidism, unspecified type  E03.9 Ambulatory referral to Endocrinology    TSH   Subclinical hypothyroid, TSH high and sx at last visit consistent with hypothyroid, restarted meds, possible autoimmune thyroid disease? Decided today after lengthy discussion for her to continue to increase levothyroxine to 100 mcg and then 150 mcg.  She is administering correctly. 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  Insomnia, unspecified type  G47.00    needs strict sleep hygeine, no napping, bedtime by 10 pm, short trial of low dose ambien to help her reset - she wants to try 2.5 mg dose first  She was instructed to do everything she can to not allow her self to take naps after school and push herself to stay up until at least 8 PM.  Because she has a long history of this sleep pattern it will be difficult at first but I believe she can keep her self up until 8 PM and normal bedtime it will be easier to stay up the next day.  Discussed sleep hygiene extensively she was given a handout.  We will try a very small dose of Ambien to try and help her get to sleep on time.  She is also encouraged to try melatonin, but if she is napping for several hours after work I do not think melatonin is can be effective at all to help her go back to sleep.  Patient does state that her poor sleep patterns are typically much worse when her thyroid is off.   3. Class 1 obesity with body mass index (BMI) of 31.0 to 31.9 in adult, unspecified obesity type, unspecified whether serious comorbidity present  E66.9    Z68.31    weight stable from last visit, hasn't started phentermine, energy/thyroid/MDD still uncontrolled I reassured her that her weight has been stable she is not gaining weight she just feels bad because of lack of sleep and I do think once were able to get her thyroid levels stable and her sleep patterns corrected that she will feel better and be able to work on her lifestyle   4. Mild episode of recurrent major depressive disorder (HCC)  F33.0    phq 9 improving, mood better, change paxil dose to at bedtime   5. Anxiety disorder, unspecified type  F41.9    anxiety improving, change paxil dose to at bedtime, encouraged her to try buspar when needed for anxiety, aggitation   Patient will return to have her TSH done in 3 weeks  Return in about 3 weeks (around 01/15/2019) for Routine follow-up labs/virtual ok.   Delsa Grana, PA-C 12/25/18 6:34 PM

## 2018-12-26 ENCOUNTER — Other Ambulatory Visit: Payer: Self-pay | Admitting: Family Medicine

## 2018-12-26 DIAGNOSIS — F331 Major depressive disorder, recurrent, moderate: Secondary | ICD-10-CM

## 2018-12-26 DIAGNOSIS — F419 Anxiety disorder, unspecified: Secondary | ICD-10-CM

## 2018-12-26 MED ORDER — LEVOTHYROXINE SODIUM 100 MCG PO TABS: 100.0000 ug | ORAL_TABLET | Freq: Every day | ORAL | 0 refills | Status: DC

## 2018-12-26 NOTE — Addendum Note (Signed)
Addended by: Delsa Grana on: 12/26/2018 04:42 PM   Modules accepted: Orders

## 2019-01-02 ENCOUNTER — Ambulatory Visit: Payer: BC Managed Care – PPO | Admitting: Family Medicine

## 2019-01-15 ENCOUNTER — Encounter: Payer: Self-pay | Admitting: Family Medicine

## 2019-01-15 ENCOUNTER — Ambulatory Visit: Payer: BC Managed Care – PPO | Admitting: Family Medicine

## 2019-01-15 ENCOUNTER — Other Ambulatory Visit: Payer: Self-pay

## 2019-01-15 VITALS — BP 104/62 | HR 92 | Temp 97.3°F | Resp 14 | Ht 68.0 in | Wt 217.7 lb

## 2019-01-15 DIAGNOSIS — E039 Hypothyroidism, unspecified: Secondary | ICD-10-CM

## 2019-01-15 DIAGNOSIS — F419 Anxiety disorder, unspecified: Secondary | ICD-10-CM

## 2019-01-15 DIAGNOSIS — F330 Major depressive disorder, recurrent, mild: Secondary | ICD-10-CM

## 2019-01-15 DIAGNOSIS — G4700 Insomnia, unspecified: Secondary | ICD-10-CM

## 2019-01-15 DIAGNOSIS — E010 Iodine-deficiency related diffuse (endemic) goiter: Secondary | ICD-10-CM

## 2019-01-15 DIAGNOSIS — Z6831 Body mass index (BMI) 31.0-31.9, adult: Secondary | ICD-10-CM

## 2019-01-15 DIAGNOSIS — E669 Obesity, unspecified: Secondary | ICD-10-CM

## 2019-01-15 NOTE — Patient Instructions (Signed)
Please let me know about 3-7 days in advance about need for med refills  I would suggest taking 1/2 a tablet of your phentermine and seeing how you feel with it and work on healthy nutrition and drink a lot of clear, low calorie fluids.

## 2019-01-15 NOTE — Progress Notes (Signed)
Name: Yvonne Little   MRN: [id number]    DOB: 1995-06-11   Date:01/15/2019       Progress Note  Chief Complaint  Patient presents with  . Depression  . Insomnia  . Follow-up     Subjective:   Yvonne Little is a 23 y.o. female, presents to clinic for routine follow up on the conditions listed above.  Hypothyroidism Yvonne Little is a 23 y.o. female who presents for follow up of hypothyroidism. Current symptoms: none and still having intermittent hot flashes and sweats (multiple times a day) . Patient denies change in energy level, diarrhea, heat / cold intolerance, nervousness, palpitations and weight changes. Symptoms have gradually improved.  Depression: Follow-up on depression and anxiety Depression screen [facility] 2/9 01/15/2019 12/25/2018 12/01/2018  Decreased Interest 1 2 0  Down, Depressed, Hopeless 1 1 1   PHQ - 2 Score 2 3 1   Altered sleeping 1 3 3   Tired, decreased energy 1 3 2   Change in appetite 2 3 1   Feeling bad or failure about yourself  0 0 3  Trouble concentrating 0 3 3  Moving slowly or fidgety/restless 0 3 0  Suicidal thoughts 0 0 0  PHQ-9 Score 6 18 13   Difficult doing work/chores Somewhat difficult Extremely dIfficult Somewhat difficult   GAD 7 : Generalized Anxiety Score 01/15/2019 12/25/2018 12/01/2018  Nervous, Anxious, on Edge 0 1 2  Control/stop worrying 0 1 2  Worry too much - different things 0 1 2  Trouble relaxing 0 1 3  Restless 0 1 1  Easily annoyed or irritable 1 1 3   Afraid - awful might happen 0 1 2  Total GAD 7 Score 1 7 15   Anxiety Difficulty Not difficult at all Not difficult at all Not difficult at all   She is continuing to take 20 mg Paxil and she did add BuSpar but she is not taking daily she is only tried a few times, but it did seem to help with her sx. she does think that having her thyroid medication back every day it is helping with her mood and other symptoms.  Overall she feels things are getting better, this is  reflected in her GAD-7 and PHQ-9, she overall looks happier and she feels better  Insomnia: Tried low dose ambien to try and help her get to sleep and strongly was encouraged to work on sleep hygiene and d/c napping.  Patient reports that she is only used half of a tablet, 2.5 milligrams dose once and she is very hesitant to use it because of family history of a very addictive personalities.  She has been able to be more disciplined with her sleep hygiene she was able to stop napping get to bed at a decent time some nights she is still staying up late to be with her boyfriend because they work opposite schedules but overall she is sleeping more at night, getting better quality of sleep and is having less fatigue and side effects the next day.   Obesity and weight loss: Phentermine prescribed from last PCP in Sept - did start taking it about 2-3 weeks ago, taking during the weekdays and not on weekend Based on our scales from last month to this month she has gained a few pounds.  She states that the phentermine pill is 37.5 mg dose, she says that it is so strong for her that she has even stopped eating for a few days.  Which is why she is giving herself breaks  of the medication.  She does not have any chest pain, palpitations dry mouth, near syncope or dizzy spells when on the medication.  But the appetite suppression is very drastic for her.  Wt Readings from Last 5 Encounters:  01/15/19 217 lb 11.2 oz (98.7 kg)  12/25/18 211 lb (95.7 kg)  12/01/18 210 lb 6.4 oz (95.4 kg)   BMI Readings from Last 5 Encounters:  01/15/19 33.10 kg/m  12/25/18 32.08 kg/m  12/01/18 31.99 kg/m      Patient Active Problem List   Diagnosis Date Noted  . Mild episode of recurrent major depressive disorder (HCC) 12/01/2018  . Class 1 obesity with body mass index (BMI) of 31.0 to 31.9 in adult 12/01/2018  . Deficiency of other vitamins 12/31/2016  . Anxiety disorder, unspecified 12/06/2016  . Subclinical  hypothyroidism 09/12/2016  . Attention and concentration deficit 08/24/2015  . Adjustment disorder with anxiety 07/27/2015    No past surgical history on file.  Family History  Problem Relation Age of Onset  . Hyperlipidemia Mother   . Hypertension Maternal Grandmother   . Hyperlipidemia Maternal Grandmother   . Hypertension Maternal Grandfather   . Hyperlipidemia Maternal Grandfather   . Stroke Maternal Grandfather   . Diabetes Paternal Grandmother   . Non-Hodgkin's lymphoma Paternal Grandmother   . Melanoma Paternal Grandfather     Social History   Socioeconomic History  . Marital status: Significant Other    Spouse name: Not on file  . Number of children: Not on file  . Years of education: 54  . Highest education level: Bachelor's degree (e.g., BA, AB, BS)  Occupational History  . Not on file  Social Needs  . Financial resource strain: Not hard at all  . Food insecurity    Worry: Never true    Inability: Never true  . Transportation needs    Medical: No    Non-medical: No  Tobacco Use  . Smoking status: Never Smoker  . Smokeless tobacco: Never Used  Substance and Sexual Activity  . Alcohol use: Yes  . Drug use: Never  . Sexual activity: Yes  Lifestyle  . Physical activity    Days per week: 0 days    Minutes per session: 0 min  . Stress: Very much  Relationships  . Social connections    Talks on phone: More than three times a week    Gets together: Once a week    Attends religious service: 1 to 4 times per year    Active member of club or organization: No    Attends meetings of clubs or organizations: Never    Relationship status: Not on file  . Intimate partner violence    Fear of current or ex partner: No    Emotionally abused: No    Physically abused: No    Forced sexual activity: No  Other Topics Concern  . Not on file  Social History Narrative  . Not on file     Current Outpatient Medications:  .  busPIRone (BUSPAR) 5 MG tablet, TAKE 1  TABLET BY MOUTH TWICE A DAY, Disp: 60 tablet, Rfl: 0 .  levothyroxine (SYNTHROID) 100 MCG tablet, Take 1 tablet (100 mcg total) by mouth daily., Disp: 60 tablet, Rfl: 0 .  PARoxetine (PAXIL) 20 MG tablet, Take 20 mg by mouth at bedtime. , Disp: , Rfl:  .  zolpidem (AMBIEN) 5 MG tablet, Take 1 tablet (5 mg total) by mouth at bedtime as needed for sleep. (Patient taking differently:  Take 2.5-5 mg by mouth at bedtime as needed for sleep. ), Disp: 15 tablet, Rfl: 1  Allergies  Allergen Reactions  . Escitalopram Other (See Comments)    Altered mental status.     I personally reviewed active problem list, medication list, allergies, family history, social history, health maintenance, notes from last encounter, lab results, imaging with the patient/caregiver today.  Review of Systems  Constitutional: Negative.   HENT: Negative.   Eyes: Negative.   Respiratory: Negative.   Cardiovascular: Negative.   Gastrointestinal: Negative.   Endocrine: Negative.   Genitourinary: Negative.   Musculoskeletal: Negative.   Skin: Negative.   Allergic/Immunologic: Negative.   Neurological: Negative.   Hematological: Negative.   Psychiatric/Behavioral: Negative.   All other systems reviewed and are negative.    Objective:    Vitals:   01/15/19 1317 01/15/19 1331  BP: 104/62   Pulse: (!) 116 92  Resp: 14   Temp: (!) 97.3 F (36.3 C)   SpO2: 99%   Weight: 217 lb 11.2 oz (98.7 kg)   Height: 5\' 8"  (1.727 m)     Body mass index is 33.1 kg/m.  Physical Exam Vitals signs and nursing note reviewed.  Constitutional:      General: She is not in acute distress.    Appearance: Normal appearance. She is well-developed. She is diaphoretic (mildly). She is not ill-appearing or toxic-appearing.     Interventions: Face mask in place.  HENT:     Head: Normocephalic and atraumatic.     Right Ear: External ear normal.     Left Ear: External ear normal.  Eyes:     General: Lids are normal. No scleral  icterus.       Right eye: No discharge.        Left eye: No discharge.     Conjunctiva/sclera: Conjunctivae normal.  Neck:     Musculoskeletal: Normal range of motion and neck supple.     Thyroid: Thyromegaly and thyroid tenderness present. No thyroid mass.     Trachea: Phonation normal. No tracheal deviation.  Cardiovascular:     Rate and Rhythm: Normal rate and regular rhythm.     Pulses: Normal pulses.          Radial pulses are 2+ on the right side and 2+ on the left side.       Posterior tibial pulses are 2+ on the right side and 2+ on the left side.     Heart sounds: Normal heart sounds. No murmur. No friction rub. No gallop.   Pulmonary:     Effort: Pulmonary effort is normal. No respiratory distress.     Breath sounds: Normal breath sounds. No stridor. No wheezing, rhonchi or rales.  Chest:     Chest wall: No tenderness.  Abdominal:     General: Bowel sounds are normal. There is no distension.     Palpations: Abdomen is soft.     Tenderness: There is no abdominal tenderness. There is no guarding or rebound.  Musculoskeletal: Normal range of motion.        General: No deformity.     Right lower leg: No edema.     Left lower leg: No edema.  Lymphadenopathy:     Cervical: No cervical adenopathy.  Skin:    General: Skin is warm.     Capillary Refill: Capillary refill takes less than 2 seconds.     Coloration: Skin is not jaundiced or pale.     Findings: No rash.  Neurological:     Mental Status: She is alert and oriented to person, place, and time.     Motor: No abnormal muscle tone.     Gait: Gait normal.  Psychiatric:        Speech: Speech normal.        Behavior: Behavior normal.      Recent Results (from the past 2160 hour(s))  COMPLETE METABOLIC PANEL WITH GFR     Status: None   Collection Time: 12/01/18 12:00 AM  Result Value Ref Range   Glucose, Bld 99 65 - 99 mg/dL    Comment: .            Fasting reference interval .    BUN 11 7 - 25 mg/dL   Creat  1.61 0.96 - 0.45 mg/dL   GFR, Est Non African American 103 > OR = 60 mL/min/1.57m2   GFR, Est African American 120 > OR = 60 mL/min/1.98m2   BUN/Creatinine Ratio NOT APPLICABLE 6 - 22 (calc)   Sodium 140 135 - 146 mmol/L   Potassium 4.6 3.5 - 5.3 mmol/L   Chloride 106 98 - 110 mmol/L   CO2 24 20 - 32 mmol/L   Calcium 9.2 8.6 - 10.2 mg/dL   Total Protein 6.7 6.1 - 8.1 g/dL   Albumin 4.3 3.6 - 5.1 g/dL   Globulin 2.4 1.9 - 3.7 g/dL (calc)   AG Ratio 1.8 1.0 - 2.5 (calc)   Total Bilirubin 0.3 0.2 - 1.2 mg/dL   Alkaline phosphatase (APISO) 68 31 - 125 U/L   AST 15 10 - 30 U/L   ALT 11 6 - 29 U/L  Lipid panel     Status: Abnormal   Collection Time: 12/01/18 12:00 AM  Result Value Ref Range   Cholesterol 155 <200 mg/dL   HDL 44 (L) > OR = 50 mg/dL   Triglycerides 51 <409 mg/dL   LDL Cholesterol (Calc) 98 mg/dL (calc)    Comment: Reference range: <100 . Desirable range <100 mg/dL for primary prevention;   <70 mg/dL for patients with CHD or diabetic patients  with > or = 2 CHD risk factors. Marland Kitchen LDL-C is now calculated using the Martin-Hopkins  calculation, which is a validated novel method providing  better accuracy than the Friedewald equation in the  estimation of LDL-C.  Horald Pollen et al. Lenox Ahr. 8119;147(82): 2061-2068  (http://education.[facility].com/faq/FAQ164)    Total CHOL/HDL Ratio 3.5 <5.0 (calc)   Non-HDL Cholesterol (Calc) 111 <130 mg/dL (calc)    Comment: For patients with diabetes plus 1 major ASCVD risk  factor, treating to a non-HDL-C goal of <100 mg/dL  (LDL-C of <95 mg/dL) is considered a therapeutic  option.   Hemoglobin A1c     Status: None   Collection Time: 12/01/18 12:00 AM  Result Value Ref Range   Hgb A1c MFr Bld 5.1 <5.7 % of total Hgb    Comment: For the purpose of screening for the presence of diabetes: . <5.7%       Consistent with the absence of diabetes 5.7-6.4%    Consistent with increased risk for diabetes             (prediabetes) > or  =6.5%  Consistent with diabetes . This assay result is consistent with a decreased risk of diabetes. . Currently, no consensus exists regarding use of hemoglobin A1c for diagnosis of diabetes in children. . According to American Diabetes Association (ADA) guidelines, hemoglobin A1c <7.0% represents optimal control in non-pregnant diabetic patients.  Different metrics may apply to specific patient populations.  Standards of Medical Care in Diabetes(ADA). .    Mean Plasma Glucose 100 (calc)   eAG (mmol/L) 5.5 (calc)  TSH     Status: Abnormal   Collection Time: 12/01/18 12:00 AM  Result Value Ref Range   TSH 6.29 (H) mIU/L    Comment:           Reference Range .           > or = 20 Years  0.40-4.50 .                Pregnancy Ranges           First trimester    0.26-2.66           Second trimester   0.55-2.73           Third trimester    0.43-2.91   CBC with Differential/Platelet     Status: None   Collection Time: 12/01/18 12:00 AM  Result Value Ref Range   WBC 6.5 3.8 - 10.8 Thousand/uL   RBC 4.18 3.80 - 5.10 Million/uL   Hemoglobin 12.8 11.7 - 15.5 g/dL   HCT 38.3 35.0 - 45.0 %   MCV 91.6 80.0 - 100.0 fL   MCH 30.6 27.0 - 33.0 pg   MCHC 33.4 32.0 - 36.0 g/dL   RDW 13.1 11.0 - 15.0 %   Platelets 250 140 - 400 Thousand/uL   MPV 9.9 7.5 - 12.5 fL   Neutro Abs 3,081 1,500 - 7,800 cells/uL   Lymphs Abs 2,535 850 - 3,900 cells/uL   Absolute Monocytes 488 200 - 950 cells/uL   Eosinophils Absolute 338 15 - 500 cells/uL   Basophils Absolute 59 0 - 200 cells/uL   Neutrophils Relative % 47.4 %   Total Lymphocyte 39.0 %   Monocytes Relative 7.5 %   Eosinophils Relative 5.2 %   Basophils Relative 0.9 %  T4, free     Status: None   Collection Time: 12/01/18 12:00 AM  Result Value Ref Range   Free T4 0.9 0.8 - 1.8 ng/dL     Fall Risk: Fall Risk  12/25/2018 12/01/2018  Falls in the past year? 0 1  Number falls in past yr: 0 1  Injury with Fall? 0 1    Assessment &  Plan:   1. Hypothyroidism, unspecified type See below - TSH - US THYROID  2. Insomnia, unspecified type Improved with sleep hygiene, she does have a few Ambien available to her but she is using very sparingly she is only tried 1 which I feel comfortable with her using them or getting refills if her sleep patterns get severely off again  3. Mild episode of recurrent major depressive disorder (HCC) Sx improving with paxil and buspar and other lifestyle changes and tx of hypothyroid  4. Anxiety disorder, unspecified type Improving sx, GAD7 score has been improving  5. Thyromegaly See below - US THYROID  6. Class 1 obesity with body mass index (BMI) of 31.0 to 31.9 in adult, unspecified obesity type, unspecified whether serious comorbidity present Patient's appetite suppression is way too drastic I have suggested to her that she break her phentermine tablets in half and only take half of a dose in the morning work on healthy habits, eating breakfast making sure she is hydrated being sure to eat small nutrient dense meals and snacks and build healthy nutrition habits and exercise so that when medications are completed in 2  to 3 months she will already have healthy habits established and she will have cued weight rebound  Advised the patient to weigh her self at home tomorrow morning and start to monitor her overall her calorie intake and her weight every few days in the morning so that when we do follow-up her weight changes can be based off of her scale at home for her virtual visit in the future (when she needs med refill she understands that she will need to do virtual visit)    Thyroid disease/thyroditis Consult with endo early Dec  -today she did complain of some mild anterior neck pain on exam unclear if it is her SCM bilaterally or diffusely enlarged thyroid gland but is tender I did not feel any nodules.  She may have Hashimoto's thyroiditis may currently be inflamed and somewhat tender  we will get a thyroid ultrasound.  We will recheck TSH with being back on levothyroxine medication currently at 100 mcg dose and clinically feeling much better  Return in about 3 months (around 04/17/2019) for Annual Physical (in 3-6 months at pt's convenience).   [name], PA-C 01/15/19 1:17 PM

## 2019-01-16 LAB — TSH: TSH: 1.93 mIU/L

## 2019-01-23 ENCOUNTER — Encounter: Payer: Self-pay | Admitting: Family Medicine

## 2019-03-08 ENCOUNTER — Encounter: Payer: Self-pay | Admitting: Family Medicine

## 2019-03-12 ENCOUNTER — Encounter: Payer: Self-pay | Admitting: Family Medicine

## 2019-04-27 ENCOUNTER — Encounter: Payer: BC Managed Care – PPO | Admitting: Family Medicine

## 2020-02-12 ENCOUNTER — Telehealth: Payer: Self-pay | Admitting: Family Medicine

## 2020-02-12 ENCOUNTER — Ambulatory Visit
Admission: EM | Admit: 2020-02-12 | Discharge: 2020-02-12 | Disposition: A | Discharge status: Home / Self Care | Payer: 59 | Attending: Family Medicine | Admitting: Family Medicine

## 2020-02-12 DIAGNOSIS — H60392 Other infective otitis externa, left ear: Secondary | ICD-10-CM

## 2020-02-12 MED ORDER — POLYMYXIN B-TRIMETHOPRIM 10000-0.1 UNIT/ML-% OP SOLN: 1.0000 [drp] | OPHTHALMIC | 0 refills | Status: DC

## 2020-02-12 MED ORDER — CIPROFLOXACIN-DEXAMETHASONE 0.3-0.1 % OT SUSP: 4.0000 [drp] | Freq: Two times a day (BID) | OTIC | 0 refills | Status: DC

## 2020-02-12 MED ORDER — AMOXICILLIN-POT CLAVULANATE 875-125 MG PO TABS: 1.0000 | ORAL_TABLET | Freq: Two times a day (BID) | ORAL | 0 refills | Status: DC

## 2020-02-12 NOTE — Telephone Encounter (Signed)
Changing abx based on cost

## 2020-02-12 NOTE — ED Provider Notes (Signed)
Renaldo Fiddler    CSN: [id number] Arrival date & time: 02/12/20  1115      History   Chief Complaint Chief Complaint  Patient presents with   Ear Pain    HPI Yvonne Little is a 24 y.o. female.   Patient is a 24 year old female presents today with left ear pain, swelling.  This is been present worsening over 3 days.  She has had some drainage from the ear.  Trouble hearing.  Throbbing, redness.  No injuries to the ear or foreign bodies.  No fevers, chills, nasal congestion or rhinorrhea.     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 No pertinent surgical history.  OB History   No obstetric history on file.      Home Medications    Prior to Admission medications   Medication Sig Start Date End Date Taking? Authorizing Provider  amoxicillin-clavulanate (AUGMENTIN) 875-125 MG tablet Take 1 tablet by mouth every 12 (twelve) hours. 02/12/20   Dahlia Byes A, NP  busPIRone (BUSPAR) 5 MG tablet TAKE 1 TABLET BY MOUTH TWICE A DAY 12/26/18   [name], PA-C  levothyroxine (SYNTHROID) 100 MCG tablet Take 1 tablet (100 mcg total) by mouth daily. 12/26/18   [name], PA-C  PARoxetine (PAXIL) 20 MG tablet Take 20 mg by mouth at bedtime.  11/05/18   [provider]  trimethoprim-polymyxin b (POLYTRIM) ophthalmic  solution Place 1 drop into the left ear every 4 (four) hours. 1 drop in left ear every 4 hours while awake x 1 week. 02/12/20   Dahlia Byes A, NP  zolpidem (AMBIEN) 5 MG tablet Take 1 tablet (5 mg total) by mouth at bedtime as needed for sleep. Patient taking differently: Take 2.5-5 mg by mouth at bedtime as needed for sleep.  12/25/18   [name], PA-C  busPIRone (BUSPAR) 5 MG tablet Take 1 tablet (5 mg total) by mouth 2 (two) times daily. Patient not taking: Reported on 12/25/2018 12/01/18   [name], PA-C    Family History Family History  Problem Relation Age of Onset   Hyperlipidemia Mother    Hypertension Maternal Grandmother    Hyperlipidemia Maternal Grandmother    Hypertension Maternal Grandfather    Hyperlipidemia Maternal Grandfather    Stroke Maternal Grandfather    Diabetes Paternal Grandmother    Non-Hodgkin's lymphoma Paternal Grandmother    Melanoma Paternal Grandfather     Social History Social History   Tobacco Use   Smoking status: Never Smoker   Smokeless tobacco: Never Used  Building services engineer Use: Every day   Substances: Nicotine  Substance Use Topics   Alcohol use: Not Currently   Drug use: Never     Allergies   Escitalopram   Review of Systems Review of Systems   Physical Exam Triage Vital Signs ED Triage  Vitals  Enc Vitals Group     BP 02/12/20 1119 (!) 97/59     Pulse Rate 02/12/20 1119 75     Resp 02/12/20 1119 16     Temp 02/12/20 1119 98.1 F (36.7 C)     Temp Source 02/12/20 1119 Oral     SpO2 --      Weight 02/12/20 1120 230 lb (104.3 kg)     Height 02/12/20 1120 5\' 8"  (1.727 m)     Head Circumference --      Peak Flow --      Pain Score 02/12/20 1120 5     Pain Loc --      Pain Edu? --      Excl. in GC? --    No data found.  Updated Vital Signs BP (!) 97/59    Pulse 75    Temp 98.1 F (36.7 C) (Oral)    Resp 16    Ht 5\' 8"  (1.727 m)    Wt 230 lb (104.3 kg)    BMI 34.97 kg/m   Visual  Acuity Right Eye Distance:   Left Eye Distance:   Bilateral Distance:    Right Eye Near:   Left Eye Near:    Bilateral Near:     Physical Exam Vitals and nursing note reviewed.  Constitutional:      General: She is not in acute distress.    Appearance: Normal appearance. She is not ill-appearing, toxic-appearing or diaphoretic.  HENT:     Head: Normocephalic.     Ears:     Comments: Left external ear normal. Erythematous and swollen ear canal but able to view the TM.  TM slightly erythematous. No specific drainage    Nose: Nose normal.  Eyes:     Conjunctiva/sclera: Conjunctivae normal.  Pulmonary:     Effort: Pulmonary effort is normal.  Musculoskeletal:        General: Normal range of motion.     Cervical back: Normal range of motion.  Skin:    General: Skin is warm and dry.     Findings: No rash.  Neurological:     Mental Status: She is alert.  Psychiatric:        Mood and Affect: Mood normal.      UC Treatments / Results  Labs (all labs ordered are listed, but only abnormal results are displayed) Labs Reviewed - No data to display  EKG   Radiology No results found.  Procedures Procedures (including critical care time)  Medications Ordered in UC Medications - No data to display  Initial Impression / Assessment and Plan / UC Course  I have reviewed the triage vital signs and the nursing notes.  Pertinent labs & imaging results that were available during my care of the patient were reviewed by me and considered in my medical decision making (see chart for details).     Otitis externa with some concern for otitis media We will go ahead and cover with Ciprodex at this time Ibuprofen every 8 hours as needed Follow up as needed for continued or worsening symptoms  Final Clinical Impressions(s) / UC Diagnoses   Final diagnoses:  Infective otitis externa of left ear     Discharge Instructions     Use the eardrops as prescribed.  You can do  ibuprofen every 8 hours up to 600 mg for pain. Follow up as needed for continued or worsening symptoms     ED Prescriptions    Medication Sig  Dispense Auth. Provider   ciprofloxacin-dexamethasone (CIPRODEX) OTIC suspension Place 4 drops into the left ear 2 (two) times daily. 7.5 mL Dahlia Byes A, NP     PDMP not reviewed this encounter.   Janace Aris, NP 02/12/20 1607

## 2020-02-12 NOTE — ED Triage Notes (Signed)
Pt reports having L ear pain x3 days. Also reports having swelling around ear. Also sts ear is throbbing and redness inside of ear.

## 2020-02-12 NOTE — Discharge Instructions (Addendum)
Use the eardrops as prescribed.  You can do ibuprofen every 8 hours up to 600 mg for pain. Follow up as needed for continued or worsening symptoms

## 2020-03-10 ENCOUNTER — Other Ambulatory Visit: Payer: 59

## 2020-06-02 LAB — HM PAP SMEAR: HM Pap smear: NEGATIVE

## 2020-08-24 ENCOUNTER — Inpatient Hospital Stay (HOSPITAL_COMMUNITY)
Admission: EM | Admit: 2020-08-24 | Discharge: 2020-08-29 | DRG: 811 | Disposition: A | Discharge status: Home / Self Care | Payer: Worker's Compensation | Attending: Internal Medicine | Admitting: Internal Medicine

## 2020-08-24 ENCOUNTER — Emergency Department (HOSPITAL_COMMUNITY): Payer: Worker's Compensation

## 2020-08-24 ENCOUNTER — Other Ambulatory Visit: Payer: Self-pay

## 2020-08-24 ENCOUNTER — Encounter (HOSPITAL_COMMUNITY): Payer: Self-pay

## 2020-08-24 DIAGNOSIS — Z79890 Hormone replacement therapy (postmenopausal): Secondary | ICD-10-CM

## 2020-08-24 DIAGNOSIS — M545 Low back pain, unspecified: Secondary | ICD-10-CM

## 2020-08-24 DIAGNOSIS — E038 Other specified hypothyroidism: Secondary | ICD-10-CM | POA: Diagnosis present

## 2020-08-24 DIAGNOSIS — B9620 Unspecified Escherichia coli [E. coli] as the cause of diseases classified elsewhere: Secondary | ICD-10-CM | POA: Diagnosis present

## 2020-08-24 DIAGNOSIS — Z79899 Other long term (current) drug therapy: Secondary | ICD-10-CM

## 2020-08-24 DIAGNOSIS — Z5329 Procedure and treatment not carried out because of patient's decision for other reasons: Secondary | ICD-10-CM | POA: Diagnosis not present

## 2020-08-24 DIAGNOSIS — D649 Anemia, unspecified: Secondary | ICD-10-CM | POA: Diagnosis present

## 2020-08-24 DIAGNOSIS — N390 Urinary tract infection, site not specified: Secondary | ICD-10-CM | POA: Diagnosis present

## 2020-08-24 DIAGNOSIS — E669 Obesity, unspecified: Secondary | ICD-10-CM | POA: Diagnosis present

## 2020-08-24 DIAGNOSIS — M549 Dorsalgia, unspecified: Secondary | ICD-10-CM | POA: Diagnosis present

## 2020-08-24 DIAGNOSIS — Z888 Allergy status to other drugs, medicaments and biological substances status: Secondary | ICD-10-CM

## 2020-08-24 DIAGNOSIS — M5459 Other low back pain: Secondary | ICD-10-CM | POA: Diagnosis present

## 2020-08-24 DIAGNOSIS — F4322 Adjustment disorder with anxiety: Secondary | ICD-10-CM | POA: Diagnosis present

## 2020-08-24 DIAGNOSIS — F339 Major depressive disorder, recurrent, unspecified: Secondary | ICD-10-CM | POA: Diagnosis present

## 2020-08-24 DIAGNOSIS — T8089XA Other complications following infusion, transfusion and therapeutic injection, initial encounter: Secondary | ICD-10-CM | POA: Diagnosis not present

## 2020-08-24 DIAGNOSIS — Z28310 Unvaccinated for covid-19: Secondary | ICD-10-CM

## 2020-08-24 DIAGNOSIS — F17290 Nicotine dependence, other tobacco product, uncomplicated: Secondary | ICD-10-CM | POA: Diagnosis present

## 2020-08-24 DIAGNOSIS — D6851 Activated protein C resistance: Secondary | ICD-10-CM | POA: Diagnosis present

## 2020-08-24 DIAGNOSIS — Z6831 Body mass index (BMI) 31.0-31.9, adult: Secondary | ICD-10-CM

## 2020-08-24 DIAGNOSIS — Y848 Other medical procedures as the cause of abnormal reaction of the patient, or of later complication, without mention of misadventure at the time of the procedure: Secondary | ICD-10-CM | POA: Diagnosis present

## 2020-08-24 DIAGNOSIS — U071 COVID-19: Secondary | ICD-10-CM | POA: Diagnosis present

## 2020-08-24 DIAGNOSIS — Z713 Dietary counseling and surveillance: Secondary | ICD-10-CM

## 2020-08-24 LAB — CBC WITH DIFFERENTIAL/PLATELET
Abs Immature Granulocytes: 0.02 10*3/uL (ref 0.00–0.07)
Basophils Absolute: 0 10*3/uL (ref 0.0–0.1)
Basophils Relative: 0 %
Eosinophils Absolute: 0.1 10*3/uL (ref 0.0–0.5)
Eosinophils Relative: 1 %
HCT: 39.7 % (ref 36.0–46.0)
Hemoglobin: 13.6 g/dL (ref 12.0–15.0)
Immature Granulocytes: 0 %
Lymphocytes Relative: 3 %
Lymphs Abs: 0.2 10*3/uL — ABNORMAL LOW (ref 0.7–4.0)
MCH: 31.4 pg (ref 26.0–34.0)
MCHC: 34.3 g/dL (ref 30.0–36.0)
MCV: 91.7 fL (ref 80.0–100.0)
Monocytes Absolute: 0.5 10*3/uL (ref 0.1–1.0)
Monocytes Relative: 7 %
Neutro Abs: 5.8 10*3/uL (ref 1.7–7.7)
Neutrophils Relative %: 89 %
Platelets: 200 10*3/uL (ref 150–400)
RBC: 4.33 MIL/uL (ref 3.87–5.11)
RDW: 12.4 % (ref 11.5–15.5)
WBC: 6.6 10*3/uL (ref 4.0–10.5)
nRBC: 0 % (ref 0.0–0.2)

## 2020-08-24 LAB — LACTIC ACID, PLASMA
Lactic Acid, Venous: 1.3 mmol/L (ref 0.5–1.9)
Lactic Acid, Venous: 0.8 mmol/L (ref 0.5–1.9)

## 2020-08-24 LAB — I-STAT BETA HCG BLOOD, ED (MC, WL, AP ONLY): I-stat hCG, quantitative: <5 m[IU]/mL (ref <5)

## 2020-08-24 LAB — URINALYSIS, ROUTINE W REFLEX MICROSCOPIC
Bilirubin Urine: NEGATIVE
Glucose, UA: NEGATIVE mg/dL
Ketones, ur: 5 mg/dL — AB
Leukocytes,Ua: NEGATIVE
Nitrite: POSITIVE — AB
Protein, ur: NEGATIVE mg/dL
Specific Gravity, Urine: 1.014 (ref 1.005–1.030)
pH: 8 (ref 5.0–8.0)

## 2020-08-24 LAB — COMPREHENSIVE METABOLIC PANEL
ALT: 30 U/L (ref 0–44)
AST: 25 U/L (ref 15–41)
Albumin: 4.2 g/dL (ref 3.5–5.0)
Alkaline Phosphatase: 78 U/L (ref 38–126)
Anion gap: 10 (ref 5–15)
BUN: 9 mg/dL (ref 6–20)
CO2: 21 mmol/L — ABNORMAL LOW (ref 22–32)
Calcium: 9.4 mg/dL (ref 8.9–10.3)
Chloride: 103 mmol/L (ref 98–111)
Creatinine, Ser: 0.89 mg/dL (ref 0.44–1.00)
GFR, Estimated: >60 mL/min (ref >60)
Glucose, Bld: 99 mg/dL (ref 70–99)
Potassium: 3.8 mmol/L (ref 3.5–5.1)
Sodium: 134 mmol/L — ABNORMAL LOW (ref 135–145)
Total Bilirubin: 0.5 mg/dL (ref 0.3–1.2)
Total Protein: 6.8 g/dL (ref 6.5–8.1)

## 2020-08-24 LAB — RESP PANEL BY RT-PCR (FLU A&B, COVID) ARPGX2
Influenza A by PCR: NEGATIVE
Influenza B by PCR: NEGATIVE
SARS Coronavirus 2 by RT PCR: POSITIVE — AB

## 2020-08-24 LAB — PROTIME-INR
INR: 1 (ref 0.8–1.2)
Prothrombin Time: 12.8 seconds (ref 11.4–15.2)

## 2020-08-24 LAB — APTT: aPTT: 26 seconds (ref 24–36)

## 2020-08-24 MED ORDER — HYDROMORPHONE HCL 1 MG/ML IJ SOLN
1.0000 mg | Freq: Once | INTRAMUSCULAR | Status: AC
  Administered 2020-08-24: 1 mg via INTRAVENOUS
  Filled 2020-08-24: qty 1

## 2020-08-24 MED ORDER — SODIUM CHLORIDE 0.9 % IV BOLUS
1000.0000 mL | Freq: Once | INTRAVENOUS | Status: AC
  Administered 2020-08-24: 1000 mL via INTRAVENOUS

## 2020-08-24 MED ORDER — ACETAMINOPHEN 325 MG PO TABS
650.0000 mg | ORAL_TABLET | Freq: Four times a day (QID) | ORAL | Status: DC | PRN
  Administered 2020-08-24: 650 mg via ORAL
  Filled 2020-08-24: qty 2

## 2020-08-24 MED ORDER — GADOBUTROL 1 MMOL/ML IV SOLN
10.0000 mL | Freq: Once | INTRAVENOUS | Status: AC | PRN
  Administered 2020-08-24: 10 mL via INTRAVENOUS

## 2020-08-24 MED ORDER — SODIUM CHLORIDE 0.9 % IV SOLN
2.0000 g | Freq: Once | INTRAVENOUS | Status: AC
  Administered 2020-08-24: 2 g via INTRAVENOUS
  Filled 2020-08-24: qty 2

## 2020-08-24 MED ORDER — ONDANSETRON HCL 4 MG/2ML IJ SOLN
4.0000 mg | Freq: Once | INTRAMUSCULAR | Status: AC
  Administered 2020-08-24: 4 mg via INTRAVENOUS
  Filled 2020-08-24: qty 2

## 2020-08-24 MED ORDER — VANCOMYCIN HCL 2000 MG/400ML IV SOLN
2000.0000 mg | Freq: Once | INTRAVENOUS | Status: DC
  Filled 2020-08-24 (×2): qty 400

## 2020-08-24 MED ORDER — SODIUM CHLORIDE 0.9 % IV SOLN: 2.0000 g | Freq: Three times a day (TID) | INTRAVENOUS | Status: DC

## 2020-08-24 NOTE — ED Triage Notes (Signed)
Pt presents with lower back pain, she had injection and stimulator injection Monday at [facility]. Steroids were not used for injections. Pt reports waking this am with nausea, fevers and tremors

## 2020-08-24 NOTE — Progress Notes (Signed)
Pharmacy Antibiotic Note  Ariadna Setter is a 25 y.o. female admitted on 08/24/2020 with low back pain.  Patient recently received a nerve injection on 6/27.  Pharmacy has been consulted for cefepime dosing for sepsis.  SCr 0.89, CrCL 118 ml/min, Tmax 100.2, WBC WNL, LA 1.3.  Plan: Cefepime 2gm IV Q8H Vanc 2gm IV load per MD, f/u with need to continue Monitor renal fxn, clinical progress  Height: 5\' 8"  (172.7 cm) Weight: 99.8 kg (220 lb) IBW/kg (Calculated) : 63.9  Temp (24hrs), Avg:99.8 F (37.7 C), Min:99.4 F (37.4 C), Max:100.2 F (37.9 C)  Recent Labs  Lab 08/24/20 1834  WBC 6.6  CREATININE 0.89  LATICACIDVEN 1.3    Estimated Creatinine Clearance: 118.4 mL/min (by C-G formula based on SCr of 0.89 mg/dL).    Allergies  Allergen Reactions   Escitalopram Other (See Comments)    Altered mental status.    Vanc x1 6/29 Cefepime 6/29 >>   6/29 UCx - 6/29 BCx -  Allissa Albright D. 7/29, PharmD, BCPS, BCCCP 08/24/2020, 8:23 PM

## 2020-08-24 NOTE — ED Provider Notes (Signed)
[facility] EMERGENCY DEPARTMENT Provider Note   CSN: [id number] Arrival date & time: 08/24/20  1647     History Chief Complaint  Patient presents with   Back Pain    Yvonne Little is a 25 y.o. female.  Pt had a nerve block in her low back on Monday.  Pt reports she had tibia fracture that cause pain.  Pt reports she did not get a steroid injection.  Pt denies any exposure to covid.  Pt reports she took a home covid test that was negative.    The history is provided by the patient. No language interpreter was used.  Back Pain Location:  Generalized Quality:  Aching Pain severity:  No pain Timing:  Constant Progression:  Worsening Chronicity:  New Relieved by:  Nothing Worsened by:  Nothing Ineffective treatments:  None tried Associated symptoms: fever and headaches   Risk factors: not pregnant       Past Medical History:  Diagnosis Date   Anxiety    Articular disc disorder of temporomandibular joint, 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   Depression    Factor 5 Leiden mutation, heterozygous (HCC)    Thyroid disease    hx of hypo    Patient Active Problem List   Diagnosis Date Noted   Mild episode of recurrent major depressive disorder (HCC) 12/01/2018   Class 1 obesity with body mass index (BMI) of 31.0 to 31.9 in adult 12/01/2018   Deficiency of other vitamins 12/31/2016   Anxiety disorder, unspecified 12/06/2016   Subclinical hypothyroidism 09/12/2016   Attention and concentration deficit 08/24/2015   Adjustment disorder with anxiety 07/27/2015    No past surgical history on file.   OB History   No obstetric history on file.     Family History  Problem Relation Age of Onset   Hyperlipidemia Mother    Hypertension Maternal Grandmother    Hyperlipidemia Maternal Grandmother    Hypertension Maternal Grandfather    Hyperlipidemia Maternal Grandfather    Stroke Maternal Grandfather    Diabetes Paternal Grandmother    Non-Hodgkin's  lymphoma Paternal Grandmother    Melanoma Paternal Grandfather     Social History   Tobacco Use   Smoking status: Never   Smokeless tobacco: Never  Vaping Use   Vaping Use: Every day   Substances: Nicotine  Substance Use Topics   Alcohol use: Not Currently   Drug use: Never    Home Medications Prior to Admission medications   Medication Sig Start Date End Date Taking? Authorizing Provider  amoxicillin-clavulanate (AUGMENTIN) 875-125 MG tablet Take 1 tablet by mouth every 12 (twelve) hours. 02/12/20   Dahlia Byes A, NP  busPIRone (BUSPAR) 5 MG tablet TAKE 1 TABLET BY MOUTH TWICE A DAY 12/26/18   [name], PA-C  levothyroxine (SYNTHROID) 100 MCG tablet Take 1 tablet (100 mcg total) by mouth daily. 12/26/18   [name], PA-C  PARoxetine (PAXIL) 20 MG tablet Take 20 mg by mouth at bedtime.  11/05/18   [provider]  trimethoprim-polymyxin b (POLYTRIM) ophthalmic solution Place 1 drop into the left ear every 4 (four) hours. 1 drop in left ear every 4 hours while awake x 1 week. 02/12/20   Dahlia Byes A, NP  zolpidem (AMBIEN) 5 MG tablet Take 1 tablet (5 mg total) by mouth at bedtime as needed for sleep. Patient taking differently: Take 2.5-5 mg by mouth at bedtime as needed for sleep.  12/25/18   [name], PA-C    Allergies  Escitalopram  Review of Systems   Review of Systems  Constitutional:  Positive for fever.  Musculoskeletal:  Positive for back pain.  Neurological:  Positive for headaches.  All other systems reviewed and are negative.  Physical Exam Updated Vital Signs BP (!) 115/53 (BP Location: Left Arm)   Pulse (!) 122   Temp 99.9 F (37.7 C) (Oral)   Resp (!) 24   Ht 5\' 8"  (1.727 m)   Wt 99.8 kg   SpO2 100%   BMI 33.45 kg/m   Physical Exam Vitals and nursing note reviewed.  Constitutional:      Appearance: She is well-developed.  HENT:     Head: Normocephalic.     Nose: Nose normal.     Mouth/Throat:     Mouth: Mucous membranes are  moist.  Cardiovascular:     Rate and Rhythm: Tachycardia present.  Pulmonary:     Effort: Pulmonary effort is normal.  Abdominal:     General: Abdomen is flat. There is no distension.  Musculoskeletal:        General: Normal range of motion.     Cervical back: Normal range of motion.     Comments: Small puncture wound low back.  Diffusely tender  Skin:    General: Skin is warm.  Neurological:     General: No focal deficit present.     Mental Status: She is alert and oriented to person, place, and time.  Psychiatric:        Mood and Affect: Mood normal.    ED Results / Procedures / Treatments   Labs (all labs ordered are listed, but only abnormal results are displayed) Labs Reviewed  RESP PANEL BY RT-PCR (FLU A&B, COVID) ARPGX2 - Abnormal; Notable for the following components:      Result Value   SARS Coronavirus 2 by RT PCR POSITIVE (*)    All other components within normal limits  COMPREHENSIVE METABOLIC PANEL - Abnormal; Notable for the following components:   Sodium 134 (*)    CO2 21 (*)    All other components within normal limits  CBC WITH DIFFERENTIAL/PLATELET - Abnormal; Notable for the following components:   Lymphs Abs 0.2 (*)    All other components within normal limits  URINALYSIS, ROUTINE W REFLEX MICROSCOPIC - Abnormal; Notable for the following components:   APPearance HAZY (*)    Hgb urine dipstick MODERATE (*)    Ketones, ur 5 (*)    Nitrite POSITIVE (*)    Bacteria, UA MANY (*)    All other components within normal limits  URINE CULTURE  CULTURE, BLOOD (ROUTINE X 2)  CULTURE, BLOOD (ROUTINE X 2)  LACTIC ACID, PLASMA  LACTIC ACID, PLASMA  PROTIME-INR  APTT  I-STAT BETA HCG BLOOD, ED ([facility], [facility], [facility] ONLY)    EKG None  Radiology MR Lumbar Spine W Wo Contrast  Result Date: 08/24/2020 CLINICAL DATA:  Trauma.  Concern for epidural abscess. EXAM: MRI LUMBAR SPINE WITHOUT AND WITH CONTRAST TECHNIQUE: Multiplanar and multiecho pulse sequences of the  lumbar spine were obtained without and with intravenous contrast. CONTRAST:  73mL GADAVIST GADOBUTROL 1 MMOL/ML IV SOLN COMPARISON:  None. FINDINGS: Segmentation:  Standard. Alignment:  Physiologic. Vertebrae: No fracture, evidence of discitis, or bone lesion. No epidural collection Conus medullaris and cauda equina: Conus extends to the L1 level. Conus and cauda equina appear normal. Paraspinal and other soft tissues: Negative Disc levels: The disc levels from T12-L5 are normal. L5-S1: Small central disc protrusion.  No  spinal canal stenosis. No abnormal contrast enhancement. IMPRESSION: 1. No epidural abscess or other acute abnormality of the lumbar spine. 2. Small central disc protrusion at L5-S1 without spinal canal stenosis. Electronically Signed   By: Deatra Robinson M.D.   On: 08/24/2020 21:57    Procedures Procedures   Medications Ordered in ED Medications  vancomycin (VANCOREADY) IVPB 2000 mg/400 mL (has no administration in time range)  ceFEPIme (MAXIPIME) 2 g in sodium chloride 0.9 % 100 mL IVPB (has no administration in time range)  sodium chloride 0.9 % bolus 1,000 mL (0 mLs Intravenous Stopped 08/24/20 2119)  ondansetron (ZOFRAN) injection 4 mg (4 mg Intravenous Given 08/24/20 2023)  HYDROmorphone (DILAUDID) injection 1 mg (1 mg Intravenous Given 08/24/20 2023)  ceFEPIme (MAXIPIME) 2 g in sodium chloride 0.9 % 100 mL IVPB (0 g Intravenous Stopped 08/24/20 2119)  gadobutrol (GADAVIST) 1 MMOL/ML injection 10 mL (10 mLs Intravenous Contrast Given 08/24/20 2144)    ED Course  I have reviewed the triage vital signs and the nursing notes.  Pertinent labs & imaging results that were available during my care of the patient were reviewed by me and considered in my medical decision making (see chart for details).    MDM Rules/Calculators/A&P                          MDM:  Iv fluids given maxipime given.  Mri shows no evidence of epidural abscess.  Covid is positive.  Pt reports some pain relief  with dilaudid.  On reevaluation, pt complains of severe pain low back.  Pt reports pain localized to area of injection.    Hospitalist to see for admission Final Clinical Impression(s) / ED Diagnoses Final diagnoses:  COVID  Acute low back pain without sciatica, unspecified back pain laterality    Rx / DC Orders ED Discharge Orders     None        Osie Cheeks 08/25/20 1528    Wynetta Fines, MD 08/25/20 2320

## 2020-08-24 NOTE — ED Provider Notes (Signed)
Emergency Medicine Provider Triage Evaluation Note  Yvonne Little , a 25 y.o. female  was evaluated in triage.  Pt complains of Low back pain.  She had a nerve injection on Monday with [facility].  She did not get any steroids.  She states that today she began feeling a significant change in her lower back pain.  She says the injections were to the nerves outside of her spinal cord but not in the spinal cord itself.  She developed fever today.  She has pain radiating down her leg.  She states that it feels like someone is pulling and scraping her insides out.  Nausea and dry heaves without vomiting.  No diarrhea.  She took a home COVID test prior to arrival and it was negative.  She is not vaccinated.  Review of Systems  Positive: Fever Negative: New numbness, changes to bowel or bladder function.   Physical Exam  BP 139/85   Pulse (!) 141   Temp 100.2 F (37.9 C) (Oral)   Resp (!) 26   SpO2 100%  Gen:   Awake, appears uncomfortable Resp:  Normal effort  MSK:   Moves extremities without difficulty  Other:  Patient appears uncomfortable.  She is able to move her toes laterally.  Medical Decision Making  Medically screening exam initiated at 6:34 PM.  Appropriate orders placed.  Yvonne Little was informed that the remainder of the evaluation will be completed by another provider, this initial triage assessment does not replace that evaluation, and the importance of remaining in the ED until their evaluation is complete.  Patient is tachycardic and 40s, with a temp of 100.2, and respiratory rate 26. Concerned that she may be septic.  Nurses made aware at about 1830.   Yvonne Gong, PA-C 08/24/20 1837    Yvonne Plan, DO 08/24/20 Yvonne Little

## 2020-08-24 NOTE — ED Notes (Signed)
Unable to get vitals, start vancomycin, or assess pain as pt is in MRI.

## 2020-08-24 NOTE — ED Notes (Signed)
Patient transported to MRI 

## 2020-08-25 ENCOUNTER — Encounter (HOSPITAL_COMMUNITY): Payer: Self-pay | Admitting: Family Medicine

## 2020-08-25 DIAGNOSIS — F339 Major depressive disorder, recurrent, unspecified: Secondary | ICD-10-CM | POA: Diagnosis present

## 2020-08-25 DIAGNOSIS — Z79899 Other long term (current) drug therapy: Secondary | ICD-10-CM | POA: Diagnosis not present

## 2020-08-25 DIAGNOSIS — M545 Low back pain, unspecified: Secondary | ICD-10-CM

## 2020-08-25 DIAGNOSIS — E669 Obesity, unspecified: Secondary | ICD-10-CM | POA: Diagnosis present

## 2020-08-25 DIAGNOSIS — Z28310 Unvaccinated for covid-19: Secondary | ICD-10-CM | POA: Diagnosis not present

## 2020-08-25 DIAGNOSIS — Z6831 Body mass index (BMI) 31.0-31.9, adult: Secondary | ICD-10-CM | POA: Diagnosis not present

## 2020-08-25 DIAGNOSIS — B9620 Unspecified Escherichia coli [E. coli] as the cause of diseases classified elsewhere: Secondary | ICD-10-CM | POA: Diagnosis present

## 2020-08-25 DIAGNOSIS — F32A Depression, unspecified: Secondary | ICD-10-CM

## 2020-08-25 DIAGNOSIS — M549 Dorsalgia, unspecified: Secondary | ICD-10-CM | POA: Diagnosis present

## 2020-08-25 DIAGNOSIS — M5459 Other low back pain: Secondary | ICD-10-CM | POA: Diagnosis present

## 2020-08-25 DIAGNOSIS — N390 Urinary tract infection, site not specified: Secondary | ICD-10-CM | POA: Diagnosis present

## 2020-08-25 DIAGNOSIS — D649 Anemia, unspecified: Secondary | ICD-10-CM

## 2020-08-25 DIAGNOSIS — D6851 Activated protein C resistance: Secondary | ICD-10-CM | POA: Diagnosis present

## 2020-08-25 DIAGNOSIS — U071 COVID-19: Secondary | ICD-10-CM | POA: Diagnosis present

## 2020-08-25 DIAGNOSIS — F4322 Adjustment disorder with anxiety: Secondary | ICD-10-CM | POA: Diagnosis present

## 2020-08-25 DIAGNOSIS — F419 Anxiety disorder, unspecified: Secondary | ICD-10-CM

## 2020-08-25 DIAGNOSIS — R319 Hematuria, unspecified: Secondary | ICD-10-CM

## 2020-08-25 DIAGNOSIS — Z79890 Hormone replacement therapy (postmenopausal): Secondary | ICD-10-CM | POA: Diagnosis not present

## 2020-08-25 DIAGNOSIS — Y848 Other medical procedures as the cause of abnormal reaction of the patient, or of later complication, without mention of misadventure at the time of the procedure: Secondary | ICD-10-CM | POA: Diagnosis present

## 2020-08-25 DIAGNOSIS — F17290 Nicotine dependence, other tobacco product, uncomplicated: Secondary | ICD-10-CM | POA: Diagnosis present

## 2020-08-25 DIAGNOSIS — E038 Other specified hypothyroidism: Secondary | ICD-10-CM | POA: Diagnosis present

## 2020-08-25 DIAGNOSIS — Z888 Allergy status to other drugs, medicaments and biological substances status: Secondary | ICD-10-CM | POA: Diagnosis not present

## 2020-08-25 DIAGNOSIS — E039 Hypothyroidism, unspecified: Secondary | ICD-10-CM

## 2020-08-25 DIAGNOSIS — T8089XA Other complications following infusion, transfusion and therapeutic injection, initial encounter: Secondary | ICD-10-CM | POA: Diagnosis present

## 2020-08-25 LAB — HIV ANTIBODY (ROUTINE TESTING W REFLEX): HIV Screen 4th Generation wRfx: NONREACTIVE

## 2020-08-25 LAB — CBC
HCT: 34.5 % — ABNORMAL LOW (ref 36.0–46.0)
HCT: 35.6 % — ABNORMAL LOW (ref 36.0–46.0)
Hemoglobin: 11.4 g/dL — ABNORMAL LOW (ref 12.0–15.0)
Hemoglobin: 12 g/dL (ref 12.0–15.0)
MCH: 30.8 pg (ref 26.0–34.0)
MCH: 30.8 pg (ref 26.0–34.0)
MCHC: 33 g/dL (ref 30.0–36.0)
MCHC: 33.7 g/dL (ref 30.0–36.0)
MCV: 93.2 fL (ref 80.0–100.0)
MCV: 91.5 fL (ref 80.0–100.0)
Platelets: 183 10*3/uL (ref 150–400)
Platelets: 168 10*3/uL (ref 150–400)
RBC: 3.7 MIL/uL — ABNORMAL LOW (ref 3.87–5.11)
RBC: 3.89 MIL/uL (ref 3.87–5.11)
RDW: 12.4 % (ref 11.5–15.5)
RDW: 12.6 % (ref 11.5–15.5)
WBC: 5.1 10*3/uL (ref 4.0–10.5)
WBC: 3.8 10*3/uL — ABNORMAL LOW (ref 4.0–10.5)
nRBC: 0 % (ref 0.0–0.2)
nRBC: 0 % (ref 0.0–0.2)

## 2020-08-25 LAB — BASIC METABOLIC PANEL
Anion gap: 8 (ref 5–15)
BUN: 6 mg/dL (ref 6–20)
CO2: 21 mmol/L — ABNORMAL LOW (ref 22–32)
Calcium: 8.2 mg/dL — ABNORMAL LOW (ref 8.9–10.3)
Chloride: 106 mmol/L (ref 98–111)
Creatinine, Ser: 0.79 mg/dL (ref 0.44–1.00)
GFR, Estimated: >60 mL/min (ref >60)
Glucose, Bld: 128 mg/dL — ABNORMAL HIGH (ref 70–99)
Potassium: 3.5 mmol/L (ref 3.5–5.1)
Sodium: 135 mmol/L (ref 135–145)

## 2020-08-25 LAB — VITAMIN B12: Vitamin B-12: 192 pg/mL (ref 180–914)

## 2020-08-25 LAB — IRON AND TIBC
Iron: 43 ug/dL (ref 28–170)
Saturation Ratios: 14 % (ref 10.4–31.8)
TIBC: 309 ug/dL (ref 250–450)
UIBC: 266 ug/dL

## 2020-08-25 LAB — RETICULOCYTES
Immature Retic Fract: 2.5 % (ref 2.3–15.9)
RBC.: 3.78 MIL/uL — ABNORMAL LOW (ref 3.87–5.11)
Retic Count, Absolute: 44.6 10*3/uL (ref 19.0–186.0)
Retic Ct Pct: 1.2 % (ref 0.4–3.1)

## 2020-08-25 LAB — FERRITIN: Ferritin: 57 ng/mL (ref 11–307)

## 2020-08-25 LAB — FOLATE: Folate: 10.9 ng/mL (ref >5.9)

## 2020-08-25 MED ORDER — KETOROLAC TROMETHAMINE 15 MG/ML IJ SOLN
15.0000 mg | Freq: Four times a day (QID) | INTRAMUSCULAR | Status: DC
  Administered 2020-08-25 – 2020-08-26 (×7): 15 mg via INTRAVENOUS
  Filled 2020-08-25 (×7): qty 1

## 2020-08-25 MED ORDER — SODIUM CHLORIDE 0.9 % IV SOLN
100.0000 mg | Freq: Every day | INTRAVENOUS | Status: DC
  Filled 2020-08-25: qty 20

## 2020-08-25 MED ORDER — ACETAMINOPHEN 325 MG PO TABS
650.0000 mg | ORAL_TABLET | Freq: Four times a day (QID) | ORAL | Status: DC | PRN
  Administered 2020-08-25 – 2020-08-26 (×2): 650 mg via ORAL
  Filled 2020-08-25 (×2): qty 2

## 2020-08-25 MED ORDER — SENNOSIDES-DOCUSATE SODIUM 8.6-50 MG PO TABS: 1.0000 | ORAL_TABLET | Freq: Every evening | ORAL | Status: DC | PRN

## 2020-08-25 MED ORDER — SODIUM CHLORIDE 0.9 % IV SOLN
200.0000 mg | Freq: Once | INTRAVENOUS | Status: DC
  Filled 2020-08-25: qty 40

## 2020-08-25 MED ORDER — ONDANSETRON HCL 4 MG/2ML IJ SOLN: 4.0000 mg | Freq: Four times a day (QID) | INTRAMUSCULAR | Status: DC | PRN

## 2020-08-25 MED ORDER — HYDROMORPHONE HCL 2 MG PO TABS
4.0000 mg | ORAL_TABLET | ORAL | Status: DC | PRN
  Administered 2020-08-25 (×2): 4 mg via ORAL
  Filled 2020-08-25 (×2): qty 2

## 2020-08-25 MED ORDER — OXYCODONE HCL 5 MG PO TABS
10.0000 mg | ORAL_TABLET | Freq: Four times a day (QID) | ORAL | Status: DC | PRN
  Administered 2020-08-26 – 2020-08-28 (×6): 10 mg via ORAL
  Filled 2020-08-25 (×6): qty 2

## 2020-08-25 MED ORDER — HYDROCODONE-ACETAMINOPHEN 5-325 MG PO TABS
1.0000 | ORAL_TABLET | ORAL | Status: DC | PRN
Start: 2020-08-25 — End: 2020-08-25
  Administered 2020-08-25: 1 via ORAL
  Filled 2020-08-25: qty 1

## 2020-08-25 MED ORDER — ONDANSETRON HCL 4 MG PO TABS: 4.0000 mg | ORAL_TABLET | Freq: Four times a day (QID) | ORAL | Status: DC | PRN

## 2020-08-25 MED ORDER — THYROID 60 MG PO TABS
90.0000 mg | ORAL_TABLET | Freq: Every day | ORAL | Status: DC
  Administered 2020-08-25 – 2020-08-29 (×5): 90 mg via ORAL
  Filled 2020-08-25 (×6): qty 1

## 2020-08-25 MED ORDER — HYDROMORPHONE HCL 2 MG PO TABS
2.0000 mg | ORAL_TABLET | ORAL | Status: DC | PRN
Start: 2020-08-25 — End: 2020-08-25

## 2020-08-25 MED ORDER — GABAPENTIN 300 MG PO CAPS
300.0000 mg | ORAL_CAPSULE | Freq: Three times a day (TID) | ORAL | Status: DC
  Filled 2020-08-25 (×2): qty 1

## 2020-08-25 MED ORDER — CLONAZEPAM 0.125 MG PO TBDP: 0.2500 mg | ORAL_TABLET | Freq: Every day | ORAL | Status: DC | PRN

## 2020-08-25 MED ORDER — GABAPENTIN 300 MG PO CAPS
300.0000 mg | ORAL_CAPSULE | Freq: Two times a day (BID) | ORAL | Status: DC
  Administered 2020-08-25: 300 mg via ORAL
  Filled 2020-08-25: qty 1

## 2020-08-25 MED ORDER — SODIUM CHLORIDE 0.9 % IV SOLN
1.0000 g | Freq: Every day | INTRAVENOUS | Status: DC
  Administered 2020-08-25 – 2020-08-27 (×3): 1 g via INTRAVENOUS
  Filled 2020-08-25 (×3): qty 10

## 2020-08-25 MED ORDER — HYDROMORPHONE HCL 1 MG/ML IJ SOLN
0.5000 mg | INTRAMUSCULAR | Status: DC | PRN
  Administered 2020-08-25 – 2020-08-28 (×2): 0.5 mg via INTRAVENOUS
  Filled 2020-08-25 (×2): qty 1

## 2020-08-25 MED ORDER — LIDOCAINE 5 % EX PTCH
1.0000 | MEDICATED_PATCH | Freq: Every day | CUTANEOUS | Status: DC
  Filled 2020-08-25: qty 1

## 2020-08-25 NOTE — ED Notes (Signed)
Attempted report x1. 

## 2020-08-25 NOTE — H&P (Signed)
History and Physical    Becci Batty FOY:[id number] DOB: September 10, 1995 DOA: 08/24/2020  PCP: [name], PA-C   Patient coming from: Home   Chief Complaint: Fever, back pain   HPI: Yvonne Little is a 25 y.o. female with medical history significant for complex regional pain syndrome, depression, anxiety, and hypothyroidism, now presenting to the emergency department for evaluation of severe low back pain and fever.  Patient reports that she had lumbar sympathetic ganglion nerve block on 08/22/20, has been experiencing severe pain at the injection site, and then woke this morning with fevers.  Pain is localized to the injection site but she has also been experiencing some mild flank pain bilaterally.  She denies dysuria or suprapubic pain.  She denies cough or shortness of breath.  ED Course: Upon arrival to the ED, patient is found to have a temperature of 37.9 C orally, tachycardic to 140, and with blood pressure 100/69.  EKG features sinus rhythm.  MRI lumbar spine is negative for epidural abscess or other acute abnormality of the lumbar spine.  COVID-19 PCR is positive.  Lactic acid is normal x2.  Urinalysis is nitrite positive.  Blood and urine cultures were ordered, 2 L of IV fluids administered, and the patient was treated with cefepime, IV Dilaudid, and acetaminophen.  Review of Systems:  All other systems reviewed and apart from HPI, are negative.  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 No pertinent surgical history.  Social History:   reports that she has never smoked. She has never used smokeless tobacco. She reports previous alcohol use. She reports that she does not use drugs.  Allergies  Allergen Reactions   Escitalopram Other (See Comments)    Altered mental  status.    Levothyroxine Nausea Only    Family History  Problem Relation Age of Onset   Hyperlipidemia Mother    Hypertension Maternal Grandmother    Hyperlipidemia Maternal Grandmother    Hypertension Maternal Grandfather    Hyperlipidemia Maternal Grandfather    Stroke Maternal Grandfather    Diabetes Paternal Grandmother    Non-Hodgkin's lymphoma Paternal Grandmother    Melanoma Paternal Grandfather      Prior to Admission medications   Medication Sig Start Date End Date Taking? Authorizing Provider  amoxicillin-clavulanate (AUGMENTIN) 875-125 MG tablet Take 1 tablet by mouth every 12 (twelve) hours. 02/12/20   Dahlia Byes A, NP  busPIRone (BUSPAR) 5 MG tablet TAKE 1 TABLET BY MOUTH TWICE A DAY 12/26/18   [name], PA-C  clonazePAM (KLONOPIN) 0.5 MG tablet Take 0.25 mg by mouth daily as needed for anxiety. 08/02/20   [provider]  cyanocobalamin (,VITAMIN B-12,) 1000 MCG/ML injection Inject 1,000 mcg into the muscle every 30 (thirty) days. 08/22/20   [provider]  levothyroxine (SYNTHROID) 100 MCG tablet Take 1 tablet (100 mcg total) by mouth daily. 12/26/18   [name], PA-C  PARoxetine (PAXIL) 20 MG tablet Take 20 mg by mouth at bedtime.  11/05/18   [provider]  trimethoprim-polymyxin b (POLYTRIM) ophthalmic solution Place 1 drop into the left ear every 4 (four) hours. 1 drop in left ear every 4 hours while awake x 1 week. 02/12/20   Dahlia Byes A, NP  zolpidem (AMBIEN) 5 MG tablet Take  1 tablet (5 mg total) by mouth at bedtime as needed for sleep. Patient taking differently: Take 2.5-5 mg by mouth at bedtime as needed for sleep.  12/25/18   Danelle Berryapia, Leisa, PA-C    Physical Exam: Vitals:   08/24/20 2025 08/24/20 2230 08/24/20 2300 08/24/20 2330  BP:  (!) 105/56 (!) 98/55 100/69  Pulse:  (!) 124 (!) 109 (!) 111  Resp:  18 (!) 21 (!) 21  Temp: 99.9 F (37.7 C)     TempSrc: Oral     SpO2:  93% 99% 100%  Weight:      Height:         Constitutional: NAD, calm  Eyes: PERTLA, lids and conjunctivae normal ENMT: Mucous membranes are moist. Posterior pharynx clear of any exudate or lesions.   Neck: supple, no masses  Respiratory:  no wheezing, no crackles. No accessory muscle use.  Cardiovascular: Rate ~120 and regular. No extremity edema.   Abdomen: No distension, no tenderness, soft. Bowel sounds active.  Musculoskeletal: no clubbing / cyanosis. No joint deformity upper and lower extremities.   Skin: no significant rashes, lesions, ulcers. Warm, dry, well-perfused. Neurologic: CN 2-12 grossly intact. Sensation intact, DTR normal. Strength 5/5 in all 4 limbs.  Psychiatric: Alert and oriented to person, place, and situation. Calm and cooperative.    Labs and Imaging on Admission: I have personally reviewed following labs and imaging studies  CBC: Recent Labs  Lab 08/24/20 1834  WBC 6.6  NEUTROABS 5.8  HGB 13.6  HCT 39.7  MCV 91.7  PLT 200   Basic Metabolic Panel: Recent Labs  Lab 08/24/20 1834  NA 134*  K 3.8  CL 103  CO2 21*  GLUCOSE 99  BUN 9  CREATININE 0.89  CALCIUM 9.4   GFR: Estimated Creatinine Clearance: 118.4 mL/min (by C-G formula based on SCr of 0.89 mg/dL). Liver Function Tests: Recent Labs  Lab 08/24/20 1834  AST 25  ALT 30  ALKPHOS 78  BILITOT 0.5  PROT 6.8  ALBUMIN 4.2   No results for input(s): LIPASE, AMYLASE in the last 168 hours. No results for input(s): AMMONIA in the last 168 hours. Coagulation Profile: Recent Labs  Lab 08/24/20 1834  INR 1.0   Cardiac Enzymes: No results for input(s): CKTOTAL, CKMB, CKMBINDEX, TROPONINI in the last 168 hours. BNP (last 3 results) No results for input(s): PROBNP in the last 8760 hours. HbA1C: No results for input(s): HGBA1C in the last 72 hours. CBG: No results for input(s): GLUCAP in the last 168 hours. Lipid Profile: No results for input(s): CHOL, HDL, LDLCALC, TRIG, CHOLHDL, LDLDIRECT in the last 72 hours. Thyroid  Function Tests: No results for input(s): TSH, T4TOTAL, FREET4, T3FREE, THYROIDAB in the last 72 hours. Anemia Panel: No results for input(s): VITAMINB12, FOLATE, FERRITIN, TIBC, IRON, RETICCTPCT in the last 72 hours. Urine analysis:    Component Value Date/Time   COLORURINE YELLOW 08/24/2020 1834   APPEARANCEUR HAZY (A) 08/24/2020 1834   LABSPEC 1.014 08/24/2020 1834   PHURINE 8.0 08/24/2020 1834   GLUCOSEU NEGATIVE 08/24/2020 1834   HGBUR MODERATE (A) 08/24/2020 1834   BILIRUBINUR NEGATIVE 08/24/2020 1834   KETONESUR 5 (A) 08/24/2020 1834   PROTEINUR NEGATIVE 08/24/2020 1834   NITRITE POSITIVE (A) 08/24/2020 1834   LEUKOCYTESUR NEGATIVE 08/24/2020 1834   Sepsis Labs: @LABRCNTIP (procalcitonin:4,lacticidven:4) ) Recent Results (from the past 240 hour(s))  Resp Panel by RT-PCR (Flu A&B, Covid) Nasopharyngeal Swab     Status: Abnormal   Collection Time: 08/24/20  8:01 PM  Specimen: Nasopharyngeal Swab; Nasopharyngeal(NP) swabs in vial transport medium  Result Value Ref Range Status   SARS Coronavirus 2 by RT PCR POSITIVE (A) NEGATIVE Final    Comment: RESULT CALLED TO, READ BACK BY AND VERIFIED WITH: J MOOREFIELD RN 08/24/20 2200 JDW (NOTE) SARS-CoV-2 target nucleic acids are DETECTED.  The SARS-CoV-2 RNA is generally detectable in upper respiratory specimens during the acute phase of infection. Positive results are indicative of the presence of the identified virus, but do not rule out bacterial infection or co-infection with other pathogens not detected by the test. Clinical correlation with patient history and other diagnostic information is necessary to determine patient infection status. The expected result is Negative.  Fact Sheet for Patients: BloggerCourse.com  Fact Sheet for Healthcare Providers: SeriousBroker.it  This test is not yet approved or cleared by the [place] FDA and  has been authorized for  detection and/or diagnosis of SARS-CoV-2 by FDA under an Emergency Use Authorization (EUA).  This EUA will remain in effect (meaning this test can be  used) for the duration of  the COVID-19 declaration under Section 564(b)(1) of the Act, 21 U.S.C. section 360bbb-3(b)(1), unless the authorization is terminated or revoked sooner.     Influenza A by PCR NEGATIVE NEGATIVE Final   Influenza B by PCR NEGATIVE NEGATIVE Final    Comment: (NOTE) The Xpert Xpress SARS-CoV-2/FLU/RSV plus assay is intended as an aid in the diagnosis of influenza from Nasopharyngeal swab specimens and should not be used as a sole basis for treatment. Nasal washings and aspirates are unacceptable for Xpert Xpress SARS-CoV-2/FLU/RSV testing.  Fact Sheet for Patients: BloggerCourse.com  Fact Sheet for Healthcare Providers: SeriousBroker.it  This test is not yet approved or cleared by the [place] FDA and has been authorized for detection and/or diagnosis of SARS-CoV-2 by FDA under an Emergency Use Authorization (EUA). This EUA will remain in effect (meaning this test can be used) for the duration of the COVID-19 declaration under Section 564(b)(1) of the Act, 21 U.S.C. section 360bbb-3(b)(1), unless the authorization is terminated or revoked.  Performed at [facility], [address] [place], [place] [address]      Radiological Exams on Admission: MR Lumbar Spine W Wo Contrast  Result Date: 08/24/2020 CLINICAL DATA:  Trauma.  Concern for epidural abscess. EXAM: MRI LUMBAR SPINE WITHOUT AND WITH CONTRAST TECHNIQUE: Multiplanar and multiecho pulse sequences of the lumbar spine were obtained without and with intravenous contrast. CONTRAST:  28mL GADAVIST GADOBUTROL 1 MMOL/ML IV SOLN COMPARISON:  None. FINDINGS: Segmentation:  Standard. Alignment:  Physiologic. Vertebrae: No fracture, evidence of discitis, or bone lesion. No epidural collection Conus  medullaris and cauda equina: Conus extends to the L1 level. Conus and cauda equina appear normal. Paraspinal and other soft tissues: Negative Disc levels: The disc levels from T12-L5 are normal. L5-S1: Small central disc protrusion.  No spinal canal stenosis. No abnormal contrast enhancement. IMPRESSION: 1. No epidural abscess or other acute abnormality of the lumbar spine. 2. Small central disc protrusion at L5-S1 without spinal canal stenosis. Electronically Signed   By: Deatra Robinson M.D.   On: 08/24/2020 21:57    EKG: Independently reviewed. Sinus rhythm.   Assessment/Plan   1. Intractable back pain  - Presents with severe low back pain and continues to complain of severe pain despite multiple doses IV Dilaudid in ED  - She has fevers but negative MRI and no elevation in WBC or lactate; fever most likely secondary to COVID  - Schedule  Toradol, start lidocaine patch, continue gabapentin, use as-needed Tylenol for mild pain, Norco for moderate, and oral hydromorphone for severe pain    2. COVID positive  - Presents with fevers, chills, and severe back pain and is found to be positive for COVID  - No respiratory signs/symptoms but has recent onset sxs and risk factor for progression to severe disease (BMI >30)    3. UTI  - Patient with fever and tachycardia most likely secondary to COVID infection but also reports left flank pain and has nitrite-positive UA concerning for possible UTI  - Culture urine, treat with Rocephin for now    4. Anxiety  - Continue Klonopin     5. Hypothyroidism  - Continue Armour thyroid     DVT prophylaxis: SCDs  Code Status: full   Level of Care: Level of care: Telemetry Medical Family Communication: husband updated at bedside  Disposition Plan:  Patient is from: home   Anticipated d/c is to: home  Anticipated d/c date is: 08/25/20 Patient currently: pending pain-control   Consults called: none   Admission status: Observation     Briscoe Deutscher,  MD [facility]  08/25/2020, 1:17 AM

## 2020-08-25 NOTE — TOC Initial Note (Signed)
Transition of Care [facility]) - Initial/Assessment Note    Patient Details  Name: Yvonne Little MRN: [id number] Date of Birth: [birth year]  Transition of Care [facility]) CM/SW Contact:    Lockie Pares, RN Phone Number: 08/25/2020, 12:49 PM  Clinical Narrative:                  25 year old admitted for back pain irritractable, COVID positive, >UTI as well. Patient normally is independent. No needs identified at this time, CM will foollow  Expected Discharge Plan: Home/Self Care Barriers to Discharge: Continued Medical Work up   Patient Goals and CMS Choice        Expected Discharge Plan and Services Expected Discharge Plan: Home/Self Care       Living arrangements for the past 2 months: Single Family Home                                      Prior Living Arrangements/Services Living arrangements for the past 2 months: Single Family Home Lives with:: Significant Other Patient language and need for interpreter reviewed:: Yes        Need for Family Participation in Patient Care: Yes (Comment) Care giver support system in place?: Yes (comment)   Criminal Activity/Legal Involvement Pertinent to Current Situation/Hospitalization: No - Comment as needed  Activities of Daily Living Home Assistive Devices/Equipment: None ADL Screening (condition at time of admission) Patient's cognitive ability adequate to safely complete daily activities?: Yes Is the patient deaf or have difficulty hearing?: No Does the patient have difficulty seeing, even when wearing glasses/contacts?: No Does the patient have difficulty concentrating, remembering, or making decisions?: No Patient able to express need for assistance with ADLs?: Yes Does the patient have difficulty dressing or bathing?: Yes (Because of pain in the lower back) Independently performs ADLs?: No Communication: Independent Dressing (OT): Needs assistance (Because of back pain) Is this a change from baseline?: Change  from baseline, expected to last <3days Grooming: Independent Feeding: Independent Bathing: Needs assistance Is this a change from baseline?: Change from baseline, expected to last <3 days Toileting: Needs assistance Is this a change from baseline?: Change from baseline, expected to last <3 days In/Out Bed: Needs assistance Is this a change from baseline?: Change from baseline, expected to last <3 days Walks in Home: Needs assistance Is this a change from baseline?: Change from baseline, expected to last <3 days Does the patient have difficulty walking or climbing stairs?: Yes (because of back pain) Weakness of Legs: Both (a little) Weakness of Arms/Hands: None  Permission Sought/Granted                  Emotional Assessment       Orientation: : Oriented to Self, Oriented to Place, Oriented to  Time, Oriented to Situation Alcohol / Substance Use: Not Applicable Psych Involvement: No (comment)  Admission diagnosis:  Urinary tract infection with hematuria, site unspecified [N39.0, R31.9] Acute low back pain without sciatica, unspecified back pain laterality [M54.50] COVID [U07.1] Patient Active Problem List   Diagnosis Date Noted   UTI (urinary tract infection) 08/25/2020   Intractable back pain 08/25/2020   COVID 08/24/2020   Mild episode of recurrent major depressive disorder (HCC) 12/01/2018   Class 1 obesity with body mass index (BMI) of 31.0 to 31.9 in adult 12/01/2018   Deficiency of other vitamins 12/31/2016   Anxiety disorder, unspecified 12/06/2016   Subclinical hypothyroidism  09/12/2016   Attention and concentration deficit 08/24/2015   Adjustment disorder with anxiety 07/27/2015   PCP:  [name], PA-C Pharmacy:   [phone] - [place], [place] - [address] [place] [address] Phone: [phone] Fax: [phone]     Social Determinants of Health (SDOH) Interventions    Readmission Risk Interventions No flowsheet  data found.

## 2020-08-25 NOTE — ED Notes (Signed)
Attempted report x 2 

## 2020-08-25 NOTE — Progress Notes (Signed)
PROGRESS NOTE  Yvonne Little MBE:[id number] DOB: 02/19/1996   PCP: Yvonne Berry, PA-C  Patient is from: Home  DOA: 08/24/2020 LOS: 0  Chief complaints:  Chief Complaint  Patient presents with   Back Pain     Brief Narrative / Interim history: 25 year old F with PMH of complex regional pain syndrome, anxiety, depression and hypothyroidism presenting to ED with acute lower back pain and fever.  Patient has left knee injury at work for which she follows up with [facility] in [place].  She had ongoing knee pain that was not controlled with p.o. medication and local injection.  As a result, she underwent lumbar sympathetic ganglion nerve block on 08/22/2020, and has been experiencing severe pain at injection site since then.  She also had a fever the morning of presentation. MRI lumbar spine is negative for epidural abscess or other acute abnormality of lumbar spine other than a small central disc protrusion at L5-S1 without spinal canal stenosis.  She has no focal neurodeficit or red flags.  She incidentally tested positive for COVID-19.  She is unvaccinated.  UA with positive nitrite and many bacteria but no UTI symptoms.  On ceftriaxone for possible UTI.  Subjective: Seen and examined earlier this morning.  She reports severe pain at recent injection site but radiates all the way to her neck and across her back.  She describes the pain as burning and ripping.  Her pain is currently 10/10.  She says Dilaudid bring this down to 1/10.  She denies numbness or tingling.  Denies bowel or bladder issues.  She denies chest pain, dyspnea, cough, GI or UTI symptoms.  Objective: Vitals:   08/25/20 0700 08/25/20 0900 08/25/20 0930 08/25/20 1000  BP: (!) 93/57 103/74 102/78 108/73  Pulse: 79 85 95 99  Resp: 17 (!) 30 17 (!) 22  Temp:      TempSrc:      SpO2: 95% 98% 99% 98%  Weight:      Height:        Intake/Output Summary (Last 24 hours) at 08/25/2020 1159 Last data filed at 08/25/2020  0133 Gross per 24 hour  Intake 2000 ml  Output --  Net 2000 ml   Filed Weights   08/24/20 1942  Weight: 99.8 kg    Examination:  GENERAL: Appears to be in pain.  Uncomfortable. HEENT: MMM.  Vision and hearing grossly intact.  NECK: Supple.  No apparent JVD.  RESP:  No IWOB.  Fair aeration bilaterally. CVS:  RRR. Heart sounds normal.  ABD/GI/GU: BS+. Abd soft, NTND.  MSK/EXT:  Moves extremities.  TTP across lower back, mainly over left lumbar paraspinal muscle. SKIN: no apparent skin lesion or wound.  No erythema or significant skin change at the site of recent injection. NEURO: Awake, alert and oriented appropriately.  No apparent focal neuro deficit. PSYCH: Calm. Normal affect.   Procedures:  None  Microbiology summarized: COVID-19 PCR positive. Blood cultures NGTD. Urine culture pending.  Assessment & Plan: Intractable back pain-remains in severe pain mainly at her recent injection site.  No clear explanation for her pain on MRI.  No focal neurodeficits or red flags. I discussed with Casimiro Needle, orthopedic surgery PA since her injection was done by [facility] in [place].  He recommended discussing with anesthesia but anesthesia has nothing to offer. Michael to let [facility] team know and see if there is anything they could offer. -Continue scheduled Toradol injection. -Increase gabapentin to 300 mg 3 times daily -As needed Tylenol, Norco and p.o. Dilaudid  as needed based on pain severity   COVID-19 infection: Incidental finding.  Not symptomatic.  She is unvaccinated. -Continue remdesivir. -Continue contact and airborne precaution   Bacteriuria-patient's has no UTI symptoms but UA with nitrite and many bacteria. -Would complete 3 days of ceftriaxone and stop -Follow urine culture  Normocytic anemia: Hgb dropped about 2 g but no obvious bleeding. Recent Labs    08/24/20 1834 08/25/20 0330  HGB 13.6 11.4*  -Recheck H&H -Check anemia panel  Sinus tachycardia:  Likely autonomic response from pain.  Improved. -Pain control as above  Anxiety/depression -Continue Klonopin      Hypothyroidism  -Continue Armour thyroid    Leukocytosis: Likely demargination versus infection -Continue monitoring  Class I obesity Body mass index is 33.45 kg/m.  -Encourage lifestyle change to lose weight.       DVT prophylaxis:  SCDs Start: 08/25/20 0115  Code Status: Full code Family Communication: updated patient's husband at bedside. Level of care: Telemetry Medical Status is: Observation  The patient will require care spanning > 2 midnights and should be moved to inpatient because: Ongoing active pain requiring inpatient pain management, IV treatments appropriate due to intensity of illness or inability to take PO, and Inpatient level of care appropriate due to severity of illness  Dispo: The patient is from: Home              Anticipated d/c is to: Home              Patient currently is not medically stable to d/c.   Difficult to place patient No       Consultants:  Orthopedic surgery PA over the phone Anesthesia over the phone   Sch Meds:  Scheduled Meds:  gabapentin  300 mg Oral TID   ketorolac  15 mg Intravenous Q6H   lidocaine  1 patch Transdermal QHS   thyroid  90 mg Oral Daily   Continuous Infusions:  cefTRIAXone (ROCEPHIN)  IV Stopped (08/25/20 0539)   remdesivir 200 mg in sodium chloride 0.9% 250 mL IVPB     Followed by   Melene Muller ON 08/26/2020] remdesivir 100 mg in NS 100 mL     PRN Meds:.acetaminophen, clonazepam, HYDROcodone-acetaminophen, HYDROmorphone, ondansetron **OR** ondansetron (ZOFRAN) IV, senna-docusate  Antimicrobials: Anti-infectives (From admission, onward)    Start     Dose/Rate Route Frequency Ordered Stop   08/26/20 1000  remdesivir 100 mg in sodium chloride 0.9 % 100 mL IVPB       See Hyperspace for full Linked Orders Report.   100 mg 200 mL/hr over 30 Minutes Intravenous Daily 08/25/20 0145 08/30/20 0959    08/25/20 0600  ceFEPIme (MAXIPIME) 2 g in sodium chloride 0.9 % 100 mL IVPB  Status:  Discontinued        2 g 200 mL/hr over 30 Minutes Intravenous Every 8 hours 08/24/20 2024 08/25/20 0116   08/25/20 0400  cefTRIAXone (ROCEPHIN) 1 g in sodium chloride 0.9 % 100 mL IVPB        1 g 200 mL/hr over 30 Minutes Intravenous Daily 08/25/20 0116     08/25/20 0230  remdesivir 200 mg in sodium chloride 0.9% 250 mL IVPB       See Hyperspace for full Linked Orders Report.   200 mg 580 mL/hr over 30 Minutes Intravenous Once 08/25/20 0145     08/24/20 2045  vancomycin (VANCOREADY) IVPB 2000 mg/400 mL  Status:  Discontinued        2,000 mg 200 mL/hr over 120  Minutes Intravenous  Once 08/24/20 2001 08/24/20 2332   08/24/20 2030  ceFEPIme (MAXIPIME) 2 g in sodium chloride 0.9 % 100 mL IVPB        2 g 200 mL/hr over 30 Minutes Intravenous  Once 08/24/20 2015 08/24/20 2119        I have personally reviewed the following labs and images: CBC: Recent Labs  Lab 08/24/20 1834 08/25/20 0330  WBC 6.6 5.1  NEUTROABS 5.8  --   HGB 13.6 11.4*  HCT 39.7 34.5*  MCV 91.7 93.2  PLT 200 183   BMP &GFR Recent Labs  Lab 08/24/20 1834 08/25/20 0330  NA 134* 135  K 3.8 3.5  CL 103 106  CO2 21* 21*  GLUCOSE 99 128*  BUN 9 6  CREATININE 0.89 0.79  CALCIUM 9.4 8.2*   Estimated Creatinine Clearance: 131.7 mL/min (by C-G formula based on SCr of 0.79 mg/dL). Liver & Pancreas: Recent Labs  Lab 08/24/20 1834  AST 25  ALT 30  ALKPHOS 78  BILITOT 0.5  PROT 6.8  ALBUMIN 4.2   No results for input(s): LIPASE, AMYLASE in the last 168 hours. No results for input(s): AMMONIA in the last 168 hours. Diabetic: No results for input(s): HGBA1C in the last 72 hours. No results for input(s): GLUCAP in the last 168 hours. Cardiac Enzymes: No results for input(s): CKTOTAL, CKMB, CKMBINDEX, TROPONINI in the last 168 hours. No results for input(s): PROBNP in the last 8760 hours. Coagulation Profile: Recent  Labs  Lab 08/24/20 1834  INR 1.0   Thyroid Function Tests: No results for input(s): TSH, T4TOTAL, FREET4, T3FREE, THYROIDAB in the last 72 hours. Lipid Profile: No results for input(s): CHOL, HDL, LDLCALC, TRIG, CHOLHDL, LDLDIRECT in the last 72 hours. Anemia Panel: No results for input(s): VITAMINB12, FOLATE, FERRITIN, TIBC, IRON, RETICCTPCT in the last 72 hours. Urine analysis:    Component Value Date/Time   COLORURINE YELLOW 08/24/2020 1834   APPEARANCEUR HAZY (A) 08/24/2020 1834   LABSPEC 1.014 08/24/2020 1834   PHURINE 8.0 08/24/2020 1834   GLUCOSEU NEGATIVE 08/24/2020 1834   HGBUR MODERATE (A) 08/24/2020 1834   BILIRUBINUR NEGATIVE 08/24/2020 1834   KETONESUR 5 (A) 08/24/2020 1834   PROTEINUR NEGATIVE 08/24/2020 1834   NITRITE POSITIVE (A) 08/24/2020 1834   LEUKOCYTESUR NEGATIVE 08/24/2020 1834   Sepsis Labs: Invalid input(s): PROCALCITONIN, LACTICIDVEN  Microbiology: Recent Results (from the past 240 hour(s))  Blood Culture (routine x 2)     Status: None (Preliminary result)   Collection Time: 08/24/20  6:40 PM   Specimen: BLOOD RIGHT ARM  Result Value Ref Range Status   Specimen Description BLOOD RIGHT ARM  Final   Special Requests   Final    BOTTLES DRAWN AEROBIC AND ANAEROBIC Blood Culture adequate volume   Culture   Final    NO GROWTH < 12 HOURS Performed at [facility], [address], [place], [place] [address]    Report Status PENDING  Incomplete  Resp Panel by RT-PCR (Flu A&B, Covid) Nasopharyngeal Swab     Status: Abnormal   Collection Time: 08/24/20  8:01 PM   Specimen: Nasopharyngeal Swab; Nasopharyngeal(NP) swabs in vial transport medium  Result Value Ref Range Status   SARS Coronavirus 2 by RT PCR POSITIVE (A) NEGATIVE Final    Comment: RESULT CALLED TO, READ BACK BY AND VERIFIED WITH: J MOOREFIELD RN 08/24/20 2200 JDW (NOTE) SARS-CoV-2 target nucleic acids are DETECTED.  The SARS-CoV-2 RNA is generally detectable in upper  respiratory  specimens during the acute phase of infection. Positive results are indicative of the presence of the identified virus, but do not rule out bacterial infection or co-infection with other pathogens not detected by the test. Clinical correlation with patient history and other diagnostic information is necessary to determine patient infection status. The expected result is Negative.  Fact Sheet for Patients: BloggerCourse.comhttps://www.fda.gov/media/152166/download  Fact Sheet for Healthcare Providers: SeriousBroker.ithttps://www.fda.gov/media/152162/download  This test is not yet approved or cleared by the [place] FDA and  has been authorized for detection and/or diagnosis of SARS-CoV-2 by FDA under an Emergency Use Authorization (EUA).  This EUA will remain in effect (meaning this test can be  used) for the duration of  the COVID-19 declaration under Section 564(b)(1) of the Act, 21 U.S.C. section 360bbb-3(b)(1), unless the authorization is terminated or revoked sooner.     Influenza A by PCR NEGATIVE NEGATIVE Final   Influenza B by PCR NEGATIVE NEGATIVE Final    Comment: (NOTE) The Xpert Xpress SARS-CoV-2/FLU/RSV plus assay is intended as an aid in the diagnosis of influenza from Nasopharyngeal swab specimens and should not be used as a sole basis for treatment. Nasal washings and aspirates are unacceptable for Xpert Xpress SARS-CoV-2/FLU/RSV testing.  Fact Sheet for Patients: BloggerCourse.comhttps://www.fda.gov/media/152166/download  Fact Sheet for Healthcare Providers: SeriousBroker.ithttps://www.fda.gov/media/152162/download  This test is not yet approved or cleared by the [place] FDA and has been authorized for detection and/or diagnosis of SARS-CoV-2 by FDA under an Emergency Use Authorization (EUA). This EUA will remain in effect (meaning this test can be used) for the duration of the COVID-19 declaration under Section 564(b)(1) of the Act, 21 U.S.C. section 360bbb-3(b)(1), unless the authorization is  terminated or revoked.  Performed at [facility], [address] [place], [place] [address]   Blood Culture (routine x 2)     Status: None (Preliminary result)   Collection Time: 08/24/20  8:04 PM   Specimen: BLOOD  Result Value Ref Range Status   Specimen Description BLOOD RIGHT ANTECUBITAL  Final   Special Requests   Final    BOTTLES DRAWN AEROBIC AND ANAEROBIC Blood Culture adequate volume   Culture   Final    NO GROWTH < 12 HOURS Performed at [facility], [address] [place], [place] [address]    Report Status PENDING  Incomplete    Radiology Studies: MR Lumbar Spine W Wo Contrast  Result Date: 08/24/2020 CLINICAL DATA:  Trauma.  Concern for epidural abscess. EXAM: MRI LUMBAR SPINE WITHOUT AND WITH CONTRAST TECHNIQUE: Multiplanar and multiecho pulse sequences of the lumbar spine were obtained without and with intravenous contrast. CONTRAST:  10mL GADAVIST GADOBUTROL 1 MMOL/ML IV SOLN COMPARISON:  None. FINDINGS: Segmentation:  Standard. Alignment:  Physiologic. Vertebrae: No fracture, evidence of discitis, or bone lesion. No epidural collection Conus medullaris and cauda equina: Conus extends to the L1 level. Conus and cauda equina appear normal. Paraspinal and other soft tissues: Negative Disc levels: The disc levels from T12-L5 are normal. L5-S1: Small central disc protrusion.  No spinal canal stenosis. No abnormal contrast enhancement. IMPRESSION: 1. No epidural abscess or other acute abnormality of the lumbar spine. 2. Small central disc protrusion at L5-S1 without spinal canal stenosis. Electronically Signed   By: Deatra RobinsonKevin  Herman M.D.   On: 08/24/2020 21:57       Verlene Glantz T. Cassandria Drew [facility]  If 7PM-7AM, please contact night-coverage [email or web address] 08/25/2020, 11:59 AM

## 2020-08-25 NOTE — Progress Notes (Signed)
08/25/2020 patient transfer from the emergency room to 2west. She is alert, oriented to person time place and situation. Patient skin was intact. She was place on telemetry when arrive on unit. [facility] RN.

## 2020-08-26 LAB — CBC
HCT: 34 % — ABNORMAL LOW (ref 36.0–46.0)
Hemoglobin: 11.5 g/dL — ABNORMAL LOW (ref 12.0–15.0)
MCH: 31 pg (ref 26.0–34.0)
MCHC: 33.8 g/dL (ref 30.0–36.0)
MCV: 91.6 fL (ref 80.0–100.0)
Platelets: 155 10*3/uL (ref 150–400)
RBC: 3.71 MIL/uL — ABNORMAL LOW (ref 3.87–5.11)
RDW: 12.6 % (ref 11.5–15.5)
WBC: 4.6 10*3/uL (ref 4.0–10.5)
nRBC: 0 % (ref 0.0–0.2)

## 2020-08-26 LAB — RENAL FUNCTION PANEL
Albumin: 3.3 g/dL — ABNORMAL LOW (ref 3.5–5.0)
Anion gap: 6 (ref 5–15)
BUN: 8 mg/dL (ref 6–20)
CO2: 24 mmol/L (ref 22–32)
Calcium: 8.5 mg/dL — ABNORMAL LOW (ref 8.9–10.3)
Chloride: 108 mmol/L (ref 98–111)
Creatinine, Ser: 0.87 mg/dL (ref 0.44–1.00)
GFR, Estimated: >60 mL/min (ref >60)
Glucose, Bld: 112 mg/dL — ABNORMAL HIGH (ref 70–99)
Phosphorus: 3.2 mg/dL (ref 2.5–4.6)
Potassium: 3.6 mmol/L (ref 3.5–5.1)
Sodium: 138 mmol/L (ref 135–145)

## 2020-08-26 LAB — MAGNESIUM: Magnesium: 1.6 mg/dL — ABNORMAL LOW (ref 1.7–2.4)

## 2020-08-26 MED ORDER — PHENOL 1.4 % MT LIQD: 1.0000 | OROMUCOSAL | Status: DC | PRN

## 2020-08-26 MED ORDER — METHOCARBAMOL 500 MG PO TABS
500.0000 mg | ORAL_TABLET | Freq: Three times a day (TID) | ORAL | Status: DC | PRN
  Administered 2020-08-26: 500 mg via ORAL
  Filled 2020-08-26: qty 1

## 2020-08-26 MED ORDER — POLYETHYLENE GLYCOL 3350 17 G PO PACK: 17.0000 g | PACK | Freq: Two times a day (BID) | ORAL | Status: DC | PRN

## 2020-08-26 MED ORDER — MAGNESIUM SULFATE 2 GM/50ML IV SOLN
2.0000 g | Freq: Once | INTRAVENOUS | Status: AC
  Administered 2020-08-26: 2 g via INTRAVENOUS
  Filled 2020-08-26: qty 50

## 2020-08-26 MED ORDER — MENTHOL 3 MG MT LOZG
1.0000 | LOZENGE | OROMUCOSAL | Status: DC | PRN
  Administered 2020-08-26 (×3): 3 mg via ORAL
  Filled 2020-08-26 (×2): qty 9

## 2020-08-26 MED ORDER — ACETAMINOPHEN 500 MG PO TABS
1000.0000 mg | ORAL_TABLET | Freq: Three times a day (TID) | ORAL | Status: DC
  Administered 2020-08-26 – 2020-08-29 (×9): 1000 mg via ORAL
  Filled 2020-08-26 (×10): qty 2

## 2020-08-26 MED ORDER — KETOROLAC TROMETHAMINE 15 MG/ML IJ SOLN
15.0000 mg | Freq: Three times a day (TID) | INTRAMUSCULAR | Status: AC
  Administered 2020-08-26 – 2020-08-27 (×3): 15 mg via INTRAVENOUS
  Filled 2020-08-26 (×3): qty 1

## 2020-08-26 MED ORDER — SENNOSIDES-DOCUSATE SODIUM 8.6-50 MG PO TABS
1.0000 | ORAL_TABLET | Freq: Two times a day (BID) | ORAL | Status: DC | PRN
  Administered 2020-08-27 – 2020-08-28 (×2): 1 via ORAL
  Filled 2020-08-26 (×2): qty 1

## 2020-08-26 NOTE — Progress Notes (Signed)
Spoke with Dr. Alanda Slim and let MD know that patient is complaining of IV hurting and would like to change sites. Patient does not have IVF running and no abx due until morning. Dr. Alanda Slim stated that patient could go without an IV for now and would reassess in the morning.

## 2020-08-26 NOTE — Progress Notes (Signed)
PROGRESS NOTE  Suzanna ObeyGabrielle Winters ZOX:[id number] DOB: Feb 08, 1996   PCP: Danelle Berryapia, Leisa, PA-C  Patient is from: Home  DOA: 08/24/2020 LOS: 1  Chief complaints:  Chief Complaint  Patient presents with   Back Pain     Brief Narrative / Interim history: 25 year old F with PMH of complex regional pain syndrome, anxiety, depression and hypothyroidism presenting to ED with acute lower back pain and fever.  Patient has left knee injury at work for which she follows up with [facility] in [place].  She had ongoing knee pain that was not controlled with p.o. medication and local injection.  As a result, she underwent lumbar sympathetic ganglion nerve block on 08/22/2020, and has been experiencing severe pain at injection site since then.  She also had a fever the morning of presentation. MRI lumbar spine is negative for epidural abscess or other acute abnormality of lumbar spine other than a small central disc protrusion at L5-S1 without spinal canal stenosis.  She has no focal neurodeficit or red flags.  She incidentally tested positive for COVID-19.  She is unvaccinated.  UA with positive nitrite and many bacteria but no UTI symptoms.  On ceftriaxone for possible UTI.    Pain improved. Urine culture with E. coli.  Sensitivity pending.  Subjective: Seen and examined earlier this morning.  No major events overnight of this morning.  Reports improvement in her pain.  She rates her pain 1/10 after she had an oxycodone.  She is under the impression that she is getting p.o. Dilaudid.  Last dose on IV Dilaudid dose last night about 8 PM.  She received 2 doses of oxycodone 10 mg this morning.  She has been refusing gabapentin this makes her sleepy.  She has been refusing remdesivir as well.  She denies respiratory symptoms other than sore throat.   Objective: Vitals:   08/25/20 1700 08/25/20 2201 08/26/20 0541 08/26/20 0931  BP:  112/66 108/66 114/71  Pulse:  88 93 81  Resp:  16 19 18   Temp:  99.7 F (37.6  C) (!) 100.9 F (38.3 C) 99.5 F (37.5 C)  TempSrc:   Oral Oral  SpO2:  100% 100% 97%  Weight: 106.5 kg     Height: 5\' 8"  (1.727 m)      No intake or output data in the 24 hours ending 08/26/20 1538  Filed Weights   08/24/20 1942 08/25/20 1700  Weight: 99.8 kg 106.5 kg    Examination:  GENERAL: No apparent distress.  Nontoxic. HEENT: MMM.  Vision and hearing grossly intact.  NECK: Supple.  No apparent JVD.  RESP:  No IWOB.  Fair aeration bilaterally. CVS:  RRR. Heart sounds normal.  ABD/GI/GU: BS+. Abd soft, NTND.  No suprapubic or CVA tenderness. MSK/EXT:  Moves extremities.  TTP over left lumbar paraspinal muscle. SKIN: no apparent skin lesion or wound NEURO: Awake and alert. Oriented appropriately.  No apparent focal neuro deficit. PSYCH: Calm. Normal affect.  Procedures:  None  Microbiology summarized: COVID-19 PCR positive. Blood cultures NGTD. Urine culture pending.  Assessment & Plan: Intractable back pain-improved.  No clear explanation for her pain on MRI.  No focal neurodeficits or red flags.  D[facility] with [facility] Don[facility] [place] where patient had her injection leading to this pain. Dr. Donette Larryoone thinks this is muscle spasm and recommended muscle relaxants and minimizing opiate. -Scheduled Tylenol -Decreased order to 15 mg 3 times daily -Added Robaxin 500 mg 3 times daily as needed -Discontinue gabapentin-has not been taking this. -  Continue oxycodone as needed for severe and breakthrough pain. -Bowel regimen -Extensive discussion about the risk and benefit of opiates -Encouraged OOB/ambulation   COVID-19 infection: Incidental finding.  Not symptomatic other than sore throat.  She is unvaccinated.  Refused remdesivir. -Discontinue remdesivir. -Continue contact and airborne precaution   Gram-negative UTI: UA with nitrite and many bacteria.  Urine culture with E. coli.  No suprapubic or CVA tenderness to suggest pyelonephritis -Continue IV  ceftriaxone pending culture sensitivity  Normocytic anemia: H&H relatively stable.  Anemia panel basically normal. Recent Labs    08/24/20 1834 08/25/20 0330 08/25/20 1336 08/26/20 0208  HGB 13.6 11.4* 12.0 11.5*  -Monitor.  Sinus tachycardia: Likely autonomic response from pain.  Resolved. -Pain control as above  Hypomagnesemia: Mg 1.6. -IV magnesium sulfate 2 g x 1  Anxiety/depression -Continue Klonopin      Hypothyroidism  -Continue Armour thyroid    Leukopenia: Resolved.  Class I obesity Body mass index is 35.7 kg/m.  -Encourage lifestyle change to lose weight.       DVT prophylaxis:  SCDs Start: 08/25/20 0115  Code Status: Full code Family Communication: updated patient's husband at bedside. Level of care: Med-Surg Status is: Inpatient  Remains inpatient appropriate because:IV treatments appropriate due to intensity of illness or inability to take PO and Inpatient level of care appropriate due to severity of illness  Dispo: The patient is from: Home              Anticipated d/c is to: Home              Patient currently is not medically stable to d/c.   Difficult to place patient No            Consultants:  Orthopedic surgery PA over the phone Anesthesia over the phone Dr. Donette Larry at [facility] in [place]   Sch Meds:  Scheduled Meds:  acetaminophen  1,000 mg Oral TID   ketorolac  15 mg Intravenous Q8H   thyroid  90 mg Oral Daily   Continuous Infusions:  cefTRIAXone (ROCEPHIN)  IV 1 g (08/26/20 0938)   PRN Meds:.clonazepam, HYDROmorphone (DILAUDID) injection, menthol-cetylpyridinium, methocarbamol, ondansetron **OR** ondansetron (ZOFRAN) IV, oxyCODONE, phenol, polyethylene glycol, senna-docusate  Antimicrobials: Anti-infectives (From admission, onward)    Start     Dose/Rate Route Frequency Ordered Stop   08/26/20 1000  remdesivir 100 mg in sodium chloride 0.9 % 100 mL IVPB  Status:  Discontinued       See Hyperspace for full Linked  Orders Report.   100 mg 200 mL/hr over 30 Minutes Intravenous Daily 08/25/20 0145 08/26/20 0744   08/25/20 0600  ceFEPIme (MAXIPIME) 2 g in sodium chloride 0.9 % 100 mL IVPB  Status:  Discontinued        2 g 200 mL/hr over 30 Minutes Intravenous Every 8 hours 08/24/20 2024 08/25/20 0116   08/25/20 0400  cefTRIAXone (ROCEPHIN) 1 g in sodium chloride 0.9 % 100 mL IVPB        1 g 200 mL/hr over 30 Minutes Intravenous Daily 08/25/20 0116     08/25/20 0230  remdesivir 200 mg in sodium chloride 0.9% 250 mL IVPB  Status:  Discontinued       See Hyperspace for full Linked Orders Report.   200 mg 580 mL/hr over 30 Minutes Intravenous Once 08/25/20 0145 08/26/20 0744   08/24/20 2045  vancomycin (VANCOREADY) IVPB 2000 mg/400 mL  Status:  Discontinued        2,000 mg 200 mL/hr over  120 Minutes Intravenous  Once 08/24/20 2001 08/24/20 2332   08/24/20 2030  ceFEPIme (MAXIPIME) 2 g in sodium chloride 0.9 % 100 mL IVPB        2 g 200 mL/hr over 30 Minutes Intravenous  Once 08/24/20 2015 08/24/20 2119        I have personally reviewed the following labs and images: CBC: Recent Labs  Lab 08/24/20 1834 08/25/20 0330 08/25/20 1336 08/26/20 0208  WBC 6.6 5.1 3.8* 4.6  NEUTROABS 5.8  --   --   --   HGB 13.6 11.4* 12.0 11.5*  HCT 39.7 34.5* 35.6* 34.0*  MCV 91.7 93.2 91.5 91.6  PLT 200 183 168 155   BMP &GFR Recent Labs  Lab 08/24/20 1834 08/25/20 0330 08/26/20 0208  NA 134* 135 138  K 3.8 3.5 3.6  CL 103 106 108  CO2 21* 21* 24  GLUCOSE 99 128* 112*  BUN 9 6 8   CREATININE 0.89 0.79 0.87  CALCIUM 9.4 8.2* 8.5*  MG  --   --  1.6*  PHOS  --   --  3.2   Estimated Creatinine Clearance: 125.1 mL/min (by C-G formula based on SCr of 0.87 mg/dL). Liver & Pancreas: Recent Labs  Lab 08/24/20 1834 08/26/20 0208  AST 25  --   ALT 30  --   ALKPHOS 78  --   BILITOT 0.5  --   PROT 6.8  --   ALBUMIN 4.2 3.3*   No results for input(s): LIPASE, AMYLASE in the last 168 hours. No results  for input(s): AMMONIA in the last 168 hours. Diabetic: No results for input(s): HGBA1C in the last 72 hours. No results for input(s): GLUCAP in the last 168 hours. Cardiac Enzymes: No results for input(s): CKTOTAL, CKMB, CKMBINDEX, TROPONINI in the last 168 hours. No results for input(s): PROBNP in the last 8760 hours. Coagulation Profile: Recent Labs  Lab 08/24/20 1834  INR 1.0   Thyroid Function Tests: No results for input(s): TSH, T4TOTAL, FREET4, T3FREE, THYROIDAB in the last 72 hours. Lipid Profile: No results for input(s): CHOL, HDL, LDLCALC, TRIG, CHOLHDL, LDLDIRECT in the last 72 hours. Anemia Panel: Recent Labs    08/25/20 1336  VITAMINB12 192  FOLATE 10.9  FERRITIN 57  TIBC 309  IRON 43  RETICCTPCT 1.2   Urine analysis:    Component Value Date/Time   COLORURINE YELLOW 08/24/2020 1834   APPEARANCEUR HAZY (A) 08/24/2020 1834   LABSPEC 1.014 08/24/2020 1834   PHURINE 8.0 08/24/2020 1834   GLUCOSEU NEGATIVE 08/24/2020 1834   HGBUR MODERATE (A) 08/24/2020 1834   BILIRUBINUR NEGATIVE 08/24/2020 1834   KETONESUR 5 (A) 08/24/2020 1834   PROTEINUR NEGATIVE 08/24/2020 1834   NITRITE POSITIVE (A) 08/24/2020 1834   LEUKOCYTESUR NEGATIVE 08/24/2020 1834   Sepsis Labs: Invalid input(s): PROCALCITONIN, LACTICIDVEN  Microbiology: Recent Results (from the past 240 hour(s))  Urine culture     Status: Abnormal (Preliminary result)   Collection Time: 08/24/20  6:34 PM   Specimen: In/Out Cath Urine  Result Value Ref Range Status   Specimen Description IN/OUT CATH URINE  Final   Special Requests   Final    NONE Performed at [facility], [address], [place], [place]    Culture >=100,000 COLONIES/mL ESCHERICHIA COLI (A)  Final   Report Status PENDING  Incomplete  Blood Culture (routine x 2)     Status: None (Preliminary result)   Collection Time: 08/24/20  6:40 PM   Specimen: BLOOD  RIGHT ARM  Result Value Ref Range Status   Specimen Description  BLOOD RIGHT ARM  Final   Special Requests   Final    BOTTLES DRAWN AEROBIC AND ANAEROBIC Blood Culture adequate volume   Culture   Final    NO GROWTH 2 DAYS Performed at [facility], [address] [place], [place] [address]    Report Status PENDING  Incomplete  Resp Panel by RT-PCR (Flu A&B, Covid) Nasopharyngeal Swab     Status: Abnormal   Collection Time: 08/24/20  8:01 PM   Specimen: Nasopharyngeal Swab; Nasopharyngeal(NP) swabs in vial transport medium  Result Value Ref Range Status   SARS Coronavirus 2 by RT PCR POSITIVE (A) NEGATIVE Final    Comment: RESULT CALLED TO, READ BACK BY AND VERIFIED WITH: J MOOREFIELD RN 08/24/20 2200 JDW (NOTE) SARS-CoV-2 target nucleic acids are DETECTED.  The SARS-CoV-2 RNA is generally detectable in upper respiratory specimens during the acute phase of infection. Positive results are indicative of the presence of the identified virus, but do not rule out bacterial infection or co-infection with other pathogens not detected by the test. Clinical correlation with patient history and other diagnostic information is necessary to determine patient infection status. The expected result is Negative.  Fact Sheet for Patients: BloggerCourse.com  Fact Sheet for Healthcare Providers: SeriousBroker.it  This test is not yet approved or cleared by the [place] FDA and  has been authorized for detection and/or diagnosis of SARS-CoV-2 by FDA under an Emergency Use Authorization (EUA).  This EUA will remain in effect (meaning this test can be  used) for the duration of  the COVID-19 declaration under Section 564(b)(1) of the Act, 21 U.S.C. section 360bbb-3(b)(1), unless the authorization is terminated or revoked sooner.     Influenza A by PCR NEGATIVE NEGATIVE Final   Influenza B by PCR NEGATIVE NEGATIVE Final    Comment: (NOTE) The Xpert Xpress SARS-CoV-2/FLU/RSV plus assay is intended as  an aid in the diagnosis of influenza from Nasopharyngeal swab specimens and should not be used as a sole basis for treatment. Nasal washings and aspirates are unacceptable for Xpert Xpress SARS-CoV-2/FLU/RSV testing.  Fact Sheet for Patients: BloggerCourse.com  Fact Sheet for Healthcare Providers: SeriousBroker.it  This test is not yet approved or cleared by the [place] FDA and has been authorized for detection and/or diagnosis of SARS-CoV-2 by FDA under an Emergency Use Authorization (EUA). This EUA will remain in effect (meaning this test can be used) for the duration of the COVID-19 declaration under Section 564(b)(1) of the Act, 21 U.S.C. section 360bbb-3(b)(1), unless the authorization is terminated or revoked.  Performed at [facility], [address] [place], [place] [address]   Blood Culture (routine x 2)     Status: None (Preliminary result)   Collection Time: 08/24/20  8:04 PM   Specimen: BLOOD  Result Value Ref Range Status   Specimen Description BLOOD RIGHT ANTECUBITAL  Final   Special Requests   Final    BOTTLES DRAWN AEROBIC AND ANAEROBIC Blood Culture adequate volume   Culture   Final    NO GROWTH 2 DAYS Performed at [facility], [address] [place], [place] [address]    Report Status PENDING  Incomplete    Radiology Studies: No results found.     Ysabel Stankovich T. Laurel Smeltz [facility]  If 7PM-7AM, please contact night-coverage [email or web address] 08/26/2020, 3:38 PM

## 2020-08-27 LAB — URINE CULTURE: Culture: >=100000 — AB

## 2020-08-27 MED ORDER — METHOCARBAMOL 500 MG PO TABS: 750.0000 mg | ORAL_TABLET | Freq: Three times a day (TID) | ORAL | Status: DC | PRN

## 2020-08-27 MED ORDER — KETOROLAC TROMETHAMINE 15 MG/ML IJ SOLN
15.0000 mg | Freq: Three times a day (TID) | INTRAMUSCULAR | Status: AC
  Administered 2020-08-27 – 2020-08-28 (×3): 15 mg via INTRAVENOUS
  Filled 2020-08-27 (×3): qty 1

## 2020-08-27 MED ORDER — PAROXETINE HCL 20 MG PO TABS
20.0000 mg | ORAL_TABLET | Freq: Every day | ORAL | Status: DC
  Administered 2020-08-28: 20 mg via ORAL
  Filled 2020-08-27 (×2): qty 1

## 2020-08-27 MED ORDER — GABAPENTIN 300 MG PO CAPS
300.0000 mg | ORAL_CAPSULE | Freq: Three times a day (TID) | ORAL | Status: DC
  Administered 2020-08-27 – 2020-08-29 (×7): 300 mg via ORAL
  Filled 2020-08-27 (×7): qty 1

## 2020-08-27 NOTE — Progress Notes (Addendum)
PROGRESS NOTE  Yvonne Little AXK:[id number] DOB: Jul 22, 1995   PCP: [name], PA-C  Patient is from: Home  DOA: 08/24/2020 LOS: 2  Chief complaints:  Chief Complaint  Patient presents with   Back Pain     Brief Narrative / Interim history: 25 year old F with PMH of complex regional pain syndrome, anxiety, depression and hypothyroidism presenting to ED with acute lower back pain and fever.  Patient has left knee injury at work for which she follows up with [facility] in [place].  She had ongoing knee pain that was not controlled with p.o. medication and local injection.  As a result, she underwent lumbar sympathetic ganglion nerve block on 08/22/2020, and has been experiencing severe pain at injection site since then.  She also had a fever the morning of presentation. MRI lumbar spine is negative for epidural abscess or other acute abnormality of lumbar spine other than a small central disc protrusion at L5-S1 without spinal canal stenosis.  She has no focal neurodeficit or red flags.  She incidentally tested positive for COVID-19.  She is unvaccinated.  UA with positive nitrite and many bacteria but no UTI symptoms.  On ceftriaxone for possible UTI.    Patient seems to be worse after initial improvement.  Anesthesia, ortho and neurosurgery had nothing to offer.  Subjective: Seen and examined earlier this morning.  Reports waking up with severe back pain last night.  Pain improved after oxycodone.  Currently rates her pain 6/10.  Patient and patient's husband concerned about inadequate pain control.  They are like to try gabapentin.  She previously refused gabapentin saying it did not serve the purpose other than making a sleepy.  Now she is asking if she could try it again.  She says Robaxin did not help.  She denies chest pain, shortness of breath, cough, GI or UTI symptoms.  Still with sore throat and irritation in both ears.  Objective: Vitals:   08/26/20 1549 08/26/20 2148  08/27/20 0658 08/27/20 1209  BP: 106/64 104/62 106/66 102/71  Pulse: 79 94 88 88  Resp: 17 16 14 16   Temp: 99 F (37.2 C) (!) 100.5 F (38.1 C) 99.5 F (37.5 C)   TempSrc: Oral Oral Oral   SpO2: 98% 100% 97% 98%  Weight:      Height:        Intake/Output Summary (Last 24 hours) at 08/27/2020 1350 Last data filed at 08/26/2020 1800 Gross per 24 hour  Intake 240 ml  Output --  Net 240 ml    Filed Weights   08/24/20 1942 08/25/20 1700  Weight: 99.8 kg 106.5 kg    Examination:  GENERAL: No apparent distress.  Nontoxic. HEENT: MMM.  Vision and hearing grossly intact.  NECK: Supple.  No apparent JVD.  RESP: On RA.  No IWOB.  Fair aeration bilaterally. CVS:  RRR. Heart sounds normal.  ABD/GI/GU: BS+. Abd soft, NTND.  MSK/EXT:  Moves extremities. No apparent deformity.  Tenderness over left lumbar paraspinal muscles SKIN: no apparent skin lesion or wound NEURO: Awake and alert. Oriented appropriately.  No apparent focal neuro deficit. PSYCH: Calm. Normal affect.   Procedures:  None  Microbiology summarized: COVID-19 PCR positive. Blood cultures NGTD. Urine culture with pansensitive E. coli.  Assessment & Plan: Intractable back pain-worse today after initial improvement.  No clear explanation for her pain on MRI.  No focal neurodeficits or red flags.  She had some autonomic response on presentation but not anymore.  Discussed with Dr. 08/27/20 from [facility]  in [place] where patient had her injection leading to this pain. Dr. Donette Larry recommended muscle relaxer and minimizing opiate.  However, Robaxin did not help, and her pain is "back to where it started".  I have also discussed with anesthesia and orthopedic surgery on-call here.  -Continue scheduled Tylenol -Continue Toradol injection -Increase Robaxin to 750 mg 3 times daily -Started gabapentin 300 mg 3 times daily -Continue oxycodone as needed for severe and breakthrough pain. -Bowel regimen -Encouraged  OOB/ambulation -Neurosurgery consulted and waiting on callback Addendum -Neurosurgery, Nundkumar called back but nothing to offer   COVID-19 infection: Incidental finding.  Not symptomatic other than sore throat.  She is unvaccinated.  Refused remdesivir stating that "people could end up on ventilator from using remdesivir". I told her remdesivir could reduce her risk of severe illness and intubation if used early but she does not agree. -Discontinued remdesivir. -Continue contact and airborne precaution   Gram-negative UTI: UA with nitrite and many bacteria.  Urine culture with E. coli.  No suprapubic or CVA tenderness to suggest pyelonephritis -Completed course with IV cefepime x1 and IV ceftriaxone for 3 days  Normocytic anemia: H&H relatively stable.  Anemia panel basically normal. Recent Labs    08/24/20 1834 08/25/20 0330 08/25/20 1336 08/26/20 0208  HGB 13.6 11.4* 12.0 11.5*  -Monitor.  Sinus tachycardia: Likely autonomic response from pain.  Resolved. -Pain control as above  Fever: Likely due to COVID-19 infection. -See above.  Hypomagnesemia: Mg 1.6. -Replenished.  Anxiety/depression: Reports taking Paxil 20 mg daily at home which is not on her MAR -Restarted Paxil 20 mg daily. -Continue Klonopin      Hypothyroidism  -Continue Armour thyroid    Leukopenia: Resolved.  Class I obesity Body mass index is 35.7 kg/m.  -Encourage lifestyle change to lose weight.       DVT prophylaxis:  SCDs Start: 08/25/20 0115  Code Status: Full code Family Communication: updated patient's husband at bedside. Level of care: Med-Surg Status is: Inpatient  Remains inpatient appropriate because:IV treatments appropriate due to intensity of illness or inability to take PO and Inpatient level of care appropriate due to severity of illness  Dispo: The patient is from: Home              Anticipated d/c is to: Home              Patient currently is not medically stable to  d/c.   Difficult to place patient No            Consultants:  Orthopedic surgery PA over the phone Anesthesia over the phone Dr. Donette Larry at [facility] in [facility] Neurosurgery   Sch Meds:  Scheduled Meds:  acetaminophen  1,000 mg Oral TID   gabapentin  300 mg Oral TID   PARoxetine  20 mg Oral Daily   thyroid  90 mg Oral Daily   Continuous Infusions:  cefTRIAXone (ROCEPHIN)  IV 1 g (08/27/20 0848)   PRN Meds:.clonazepam, HYDROmorphone (DILAUDID) injection, menthol-cetylpyridinium, methocarbamol, ondansetron **OR** ondansetron (ZOFRAN) IV, oxyCODONE, phenol, polyethylene glycol, senna-docusate  Antimicrobials: Anti-infectives (From admission, onward)    Start     Dose/Rate Route Frequency Ordered Stop   08/26/20 1000  remdesivir 100 mg in sodium chloride 0.9 % 100 mL IVPB  Status:  Discontinued       See Hyperspace for full Linked Orders Report.   100 mg 200 mL/hr over 30 Minutes Intravenous Daily 08/25/20 0145 08/26/20 0744   08/25/20 0600  ceFEPIme (MAXIPIME) 2 g in sodium  chloride 0.9 % 100 mL IVPB  Status:  Discontinued        2 g 200 mL/hr over 30 Minutes Intravenous Every 8 hours 08/24/20 2024 08/25/20 0116   08/25/20 0400  cefTRIAXone (ROCEPHIN) 1 g in sodium chloride 0.9 % 100 mL IVPB        1 g 200 mL/hr over 30 Minutes Intravenous Daily 08/25/20 0116     08/25/20 0230  remdesivir 200 mg in sodium chloride 0.9% 250 mL IVPB  Status:  Discontinued       See Hyperspace for full Linked Orders Report.   200 mg 580 mL/hr over 30 Minutes Intravenous Once 08/25/20 0145 08/26/20 0744   08/24/20 2045  vancomycin (VANCOREADY) IVPB 2000 mg/400 mL  Status:  Discontinued        2,000 mg 200 mL/hr over 120 Minutes Intravenous  Once 08/24/20 2001 08/24/20 2332   08/24/20 2030  ceFEPIme (MAXIPIME) 2 g in sodium chloride 0.9 % 100 mL IVPB        2 g 200 mL/hr over 30 Minutes Intravenous  Once 08/24/20 2015 08/24/20 2119        I have personally reviewed the following  labs and images: CBC: Recent Labs  Lab 08/24/20 1834 08/25/20 0330 08/25/20 1336 08/26/20 0208  WBC 6.6 5.1 3.8* 4.6  NEUTROABS 5.8  --   --   --   HGB 13.6 11.4* 12.0 11.5*  HCT 39.7 34.5* 35.6* 34.0*  MCV 91.7 93.2 91.5 91.6  PLT 200 183 168 155   BMP &GFR Recent Labs  Lab 08/24/20 1834 08/25/20 0330 08/26/20 0208  NA 134* 135 138  K 3.8 3.5 3.6  CL 103 106 108  CO2 21* 21* 24  GLUCOSE 99 128* 112*  BUN 9 6 8   CREATININE 0.89 0.79 0.87  CALCIUM 9.4 8.2* 8.5*  MG  --   --  1.6*  PHOS  --   --  3.2   Estimated Creatinine Clearance: 125.1 mL/min (by C-G formula based on SCr of 0.87 mg/dL). Liver & Pancreas: Recent Labs  Lab 08/24/20 1834 08/26/20 0208  AST 25  --   ALT 30  --   ALKPHOS 78  --   BILITOT 0.5  --   PROT 6.8  --   ALBUMIN 4.2 3.3*   No results for input(s): LIPASE, AMYLASE in the last 168 hours. No results for input(s): AMMONIA in the last 168 hours. Diabetic: No results for input(s): HGBA1C in the last 72 hours. No results for input(s): GLUCAP in the last 168 hours. Cardiac Enzymes: No results for input(s): CKTOTAL, CKMB, CKMBINDEX, TROPONINI in the last 168 hours. No results for input(s): PROBNP in the last 8760 hours. Coagulation Profile: Recent Labs  Lab 08/24/20 1834  INR 1.0   Thyroid Function Tests: No results for input(s): TSH, T4TOTAL, FREET4, T3FREE, THYROIDAB in the last 72 hours. Lipid Profile: No results for input(s): CHOL, HDL, LDLCALC, TRIG, CHOLHDL, LDLDIRECT in the last 72 hours. Anemia Panel: Recent Labs    08/25/20 1336  VITAMINB12 192  FOLATE 10.9  FERRITIN 57  TIBC 309  IRON 43  RETICCTPCT 1.2   Urine analysis:    Component Value Date/Time   COLORURINE YELLOW 08/24/2020 1834   APPEARANCEUR HAZY (A) 08/24/2020 1834   LABSPEC 1.014 08/24/2020 1834   PHURINE 8.0 08/24/2020 1834   GLUCOSEU NEGATIVE 08/24/2020 1834   HGBUR MODERATE (A) 08/24/2020 1834   BILIRUBINUR NEGATIVE 08/24/2020 1834   KETONESUR 5  (A) 08/24/2020 1834  PROTEINUR NEGATIVE 08/24/2020 1834   NITRITE POSITIVE (A) 08/24/2020 1834   LEUKOCYTESUR NEGATIVE 08/24/2020 1834   Sepsis Labs: Invalid input(s): PROCALCITONIN, LACTICIDVEN  Microbiology: Recent Results (from the past 240 hour(s))  Urine culture     Status: Abnormal   Collection Time: 08/24/20  6:34 PM   Specimen: In/Out Cath Urine  Result Value Ref Range Status   Specimen Description IN/OUT CATH URINE  Final   Special Requests   Final    NONE Performed at [facility], [address] [place], [place] [address]    Culture >=100,000 COLONIES/mL ESCHERICHIA COLI (A)  Final   Report Status 08/27/2020 FINAL  Final   Organism ID, Bacteria ESCHERICHIA COLI (A)  Final      Susceptibility   Escherichia coli - MIC*    AMPICILLIN <=2 SENSITIVE Sensitive     CEFAZOLIN <=4 SENSITIVE Sensitive     CEFEPIME <=0.12 SENSITIVE Sensitive     CEFTRIAXONE <=0.25 SENSITIVE Sensitive     CIPROFLOXACIN <=0.25 SENSITIVE Sensitive     GENTAMICIN <=1 SENSITIVE Sensitive     IMIPENEM <=0.25 SENSITIVE Sensitive     NITROFURANTOIN <=16 SENSITIVE Sensitive     TRIMETH/SULFA <=20 SENSITIVE Sensitive     AMPICILLIN/SULBACTAM <=2 SENSITIVE Sensitive     PIP/TAZO <=4 SENSITIVE Sensitive     * >=100,000 COLONIES/mL ESCHERICHIA COLI  Blood Culture (routine x 2)     Status: None (Preliminary result)   Collection Time: 08/24/20  6:40 PM   Specimen: BLOOD RIGHT ARM  Result Value Ref Range Status   Specimen Description BLOOD RIGHT ARM  Final   Special Requests   Final    BOTTLES DRAWN AEROBIC AND ANAEROBIC Blood Culture adequate volume   Culture   Final    NO GROWTH 3 DAYS Performed at [facility], [address], [place], [place] [address]    Report Status PENDING  Incomplete  Resp Panel by RT-PCR (Flu A&B, Covid) Nasopharyngeal Swab     Status: Abnormal   Collection Time: 08/24/20  8:01 PM   Specimen: Nasopharyngeal Swab; Nasopharyngeal(NP) swabs in vial transport  medium  Result Value Ref Range Status   SARS Coronavirus 2 by RT PCR POSITIVE (A) NEGATIVE Final    Comment: RESULT CALLED TO, READ BACK BY AND VERIFIED WITH: J MOOREFIELD RN 08/24/20 2200 JDW (NOTE) SARS-CoV-2 target nucleic acids are DETECTED.  The SARS-CoV-2 RNA is generally detectable in upper respiratory specimens during the acute phase of infection. Positive results are indicative of the presence of the identified virus, but do not rule out bacterial infection or co-infection with other pathogens not detected by the test. Clinical correlation with patient history and other diagnostic information is necessary to determine patient infection status. The expected result is Negative.  Fact Sheet for Patients: BloggerCourse.com  Fact Sheet for Healthcare Providers: SeriousBroker.it  This test is not yet approved or cleared by the [place] FDA and  has been authorized for detection and/or diagnosis of SARS-CoV-2 by FDA under an Emergency Use Authorization (EUA).  This EUA will remain in effect (meaning this test can be  used) for the duration of  the COVID-19 declaration under Section 564(b)(1) of the Act, 21 U.S.C. section 360bbb-3(b)(1), unless the authorization is terminated or revoked sooner.     Influenza A by PCR NEGATIVE NEGATIVE Final   Influenza B by PCR NEGATIVE NEGATIVE Final    Comment: (NOTE) The Xpert Xpress SARS-CoV-2/FLU/RSV plus assay is intended as an aid in the diagnosis  of influenza from Nasopharyngeal swab specimens and should not be used as a sole basis for treatment. Nasal washings and aspirates are unacceptable for Xpert Xpress SARS-CoV-2/FLU/RSV testing.  Fact Sheet for Patients: BloggerCourse.com  Fact Sheet for Healthcare Providers: SeriousBroker.it  This test is not yet approved or cleared by the [place] FDA and has been authorized for  detection and/or diagnosis of SARS-CoV-2 by FDA under an Emergency Use Authorization (EUA). This EUA will remain in effect (meaning this test can be used) for the duration of the COVID-19 declaration under Section 564(b)(1) of the Act, 21 U.S.C. section 360bbb-3(b)(1), unless the authorization is terminated or revoked.  Performed at [facility], [address], [place], [place] [address]   Blood Culture (routine x 2)     Status: None (Preliminary result)   Collection Time: 08/24/20  8:04 PM   Specimen: BLOOD  Result Value Ref Range Status   Specimen Description BLOOD RIGHT ANTECUBITAL  Final   Special Requests   Final    BOTTLES DRAWN AEROBIC AND ANAEROBIC Blood Culture adequate volume   Culture   Final    NO GROWTH 3 DAYS Performed at [facility], [address], [place], [place] [address]    Report Status PENDING  Incomplete    Radiology Studies: No results found.     Cayman Brogden T. Natalija Mavis [facility]  If 7PM-7AM, please contact night-coverage [email or web address] 08/27/2020, 1:50 PM

## 2020-08-28 LAB — CBC
HCT: 35.4 % — ABNORMAL LOW (ref 36.0–46.0)
Hemoglobin: 11.9 g/dL — ABNORMAL LOW (ref 12.0–15.0)
MCH: 30.7 pg (ref 26.0–34.0)
MCHC: 33.6 g/dL (ref 30.0–36.0)
MCV: 91.5 fL (ref 80.0–100.0)
Platelets: 151 10*3/uL (ref 150–400)
RBC: 3.87 MIL/uL (ref 3.87–5.11)
RDW: 12.7 % (ref 11.5–15.5)
WBC: 8.6 10*3/uL (ref 4.0–10.5)
nRBC: 0 % (ref 0.0–0.2)

## 2020-08-28 LAB — RENAL FUNCTION PANEL
Albumin: 3.3 g/dL — ABNORMAL LOW (ref 3.5–5.0)
Anion gap: 9 (ref 5–15)
BUN: 7 mg/dL (ref 6–20)
CO2: 26 mmol/L (ref 22–32)
Calcium: 8.7 mg/dL — ABNORMAL LOW (ref 8.9–10.3)
Chloride: 103 mmol/L (ref 98–111)
Creatinine, Ser: 0.78 mg/dL (ref 0.44–1.00)
GFR, Estimated: >60 mL/min (ref >60)
Glucose, Bld: 117 mg/dL — ABNORMAL HIGH (ref 70–99)
Phosphorus: 3.4 mg/dL (ref 2.5–4.6)
Potassium: 4.2 mmol/L (ref 3.5–5.1)
Sodium: 138 mmol/L (ref 135–145)

## 2020-08-28 LAB — MAGNESIUM: Magnesium: 1.7 mg/dL (ref 1.7–2.4)

## 2020-08-28 MED ORDER — PANTOPRAZOLE SODIUM 40 MG PO TBEC
40.0000 mg | DELAYED_RELEASE_TABLET | Freq: Every day | ORAL | Status: DC
  Administered 2020-08-28 – 2020-08-29 (×3): 40 mg via ORAL
  Filled 2020-08-28 (×3): qty 1

## 2020-08-28 MED ORDER — ENOXAPARIN SODIUM 40 MG/0.4ML IJ SOSY
40.0000 mg | PREFILLED_SYRINGE | INTRAMUSCULAR | Status: DC
  Administered 2020-08-28: 40 mg via SUBCUTANEOUS
  Filled 2020-08-28: qty 0.4

## 2020-08-28 MED ORDER — CAPSAICIN 0.025 % EX CREA
TOPICAL_CREAM | Freq: Two times a day (BID) | CUTANEOUS | Status: DC
  Filled 2020-08-28: qty 60

## 2020-08-28 MED ORDER — IBUPROFEN 600 MG PO TABS
600.0000 mg | ORAL_TABLET | Freq: Three times a day (TID) | ORAL | Status: DC
  Administered 2020-08-28 – 2020-08-29 (×3): 600 mg via ORAL
  Filled 2020-08-28 (×3): qty 1

## 2020-08-28 MED ORDER — DULOXETINE HCL 30 MG PO CPEP
30.0000 mg | ORAL_CAPSULE | Freq: Every day | ORAL | Status: DC
  Administered 2020-08-28 – 2020-08-29 (×2): 30 mg via ORAL
  Filled 2020-08-28 (×2): qty 1

## 2020-08-28 NOTE — Plan of Care (Signed)
  Problem: Education: Goal: Knowledge of risk factors and measures for prevention of condition will improve Outcome: Progressing   Problem: Coping: Goal: Psychosocial and spiritual needs will be supported Outcome: Progressing   Problem: Respiratory: Goal: Will maintain a patent airway Outcome: Progressing Goal: Complications related to the disease process, condition or treatment will be avoided or minimized Outcome: Progressing   

## 2020-08-28 NOTE — Progress Notes (Addendum)
PROGRESS NOTE  Yvonne Little WUJ:[id number] DOB: 1996/01/07   PCP: [name], PA-C  Patient is from: Home  DOA: 08/24/2020 LOS: 3  Chief complaints:  Chief Complaint  Patient presents with   Back Pain     Brief Narrative  25 year old F with PMH of complex regional pain syndrome, anxiety, depression and hypothyroidism presenting to ED with acute lower back pain and fever.  Patient has left knee injury at work for which she follows up with [facility] in [place].  She had ongoing knee pain that was not controlled with p.o. medication and local injection.  As a result, she underwent lumbar sympathetic ganglion nerve block on 08/22/2020, and has been experiencing severe pain at injection on her left lower back site since then.  She also had a fever the morning of presentation.  She was subsequently admitted to the hospitalist service for further evaluation and treatment.  MRI of the lumbar spine was negative for any acute abnormalities-there was no signs of any infection.  She was also found to be COVID-19 positive.     Subjective: Lying comfortably in bed-continues to have pain mostly in her left lower back area.  Appears very comfortable.  Objective: Vitals:   08/27/20 0658 08/27/20 1209 08/27/20 2052 08/28/20 0603  BP: 106/66 102/71 115/69 103/62  Pulse: 88 88 98 70  Resp: 14 16 16 14   Temp: 99.5 F (37.5 C)  99.3 F (37.4 C) 98.7 F (37.1 C)  TempSrc: Oral     SpO2: 97% 98% 99% 97%  Weight:      Height:        Intake/Output Summary (Last 24 hours) at 08/28/2020 1022 Last data filed at 08/27/2020 1600 Gross per 24 hour  Intake 921.75 ml  Output --  Net 921.75 ml     Filed Weights   08/24/20 1942 08/25/20 1700  Weight: 99.8 kg 106.5 kg    Examination: Gen Exam:Alert awake-not in any distress HEENT:atraumatic, normocephalic Chest: B/L clear to auscultation anteriorly CVS:S1S2 regular Abdomen:soft non tender, non distended Extremities:no edema Neurology: Non  focal Skin: no rash    Procedures:  None  Microbiology summarized: COVID-19 PCR positive. Blood cultures NGTD. Urine culture with pansensitive E. coli.  Assessment & Plan: Intractable back pain: Continues to have some lower back pain-but acknowledges improvement compared to how she first presented to the hospital.  Continue scheduled Tylenol-add scheduled NSAIDs-continue Robaxin/gabapentin and as needed narcotics.  Prior MD-Dr. 08/27/20 with orthopedics/anesthesia/neurosurgery-recommendations are to continue with current care-these services have nothing additional to offer.  I have provided reassurance this morning regarding stability of her exam and benign MRI scan.  Once her pain is adequately controlled-plan is to discharge so she can follow-up with her outpatient MDs.  We will try to add capsaicin cream to the local site.  She is allergic to Lidoderm-therefore would like to avoid Lidoderm patch.  After discussion with patient-she is agreeable to change from Paxil to Cymbalta (spoke with pharmacy-Paxil does not need to be tapered down). Once her pain is adequately controlled-plan is to discharge so she can follow-up with her outpatient MDs.  I have encouraged patient to mobilize as much as possible.   COVID-19 infection: Currently asymptomatic-she is unvaccinated-she declined Remdesivir.  She was encouraged to get vaccinated post discharge.   E. coli UTI: Completed treatment with 3 days of Rocephin.  Clinical features not consistent with pyelonephritis.  Normocytic anemia: H&H relatively stable.  Anemia panel basically normal. Recent Labs    08/24/20 1834 08/25/20 0330  08/25/20 1336 08/26/20 0208 08/28/20 0546  HGB 13.6 11.4* 12.0 11.5* 11.9*    Sinus tachycardia: Likely autonomic response from pain.  Resolved.  Fever: Likely due to COVID-19 infection +/- UTI.  See above. -See above.  Hypomagnesemia: Repleted.  Anxiety/depression: Given her pain issues-we will switch Paxil  to Cymbalta-patient was agreeable with this plan-subsequently spoke with pharmacist-no need to taper Paxil as she will be on Cymbalta.  Continue with as needed Klonopin.   Hypothyroidism:Continue Armour thyroid    Leukopenia: Resolved.  Likely due to COVID-19 infection.  Class I obesity Body mass index is 35.7 kg/m.  -Encourage lifestyle change to lose weight.     DVT prophylaxis:  SCDs Start: 08/25/20 0115 Start prophylactic Lovenox  Code Status: Full code Family Communication: updated patient's husband at bedside. Level of care: Med-Surg Status is: Inpatient  Remains inpatient appropriate because:IV treatments appropriate due to intensity of illness or inability to take PO and Inpatient level of care appropriate due to severity of illness  Dispo: The patient is from: Home              Anticipated d/c is to: Home              Patient currently is not medically stable to d/c.   Difficult to place patient No    Consultants:  Orthopedic surgery PA over the phone by Dr. Alanda Slim Anesthesia over the phone by Dr. Alanda Slim Dr. Donette Larry at [facility] in [place] by Dr. Alanda Slim Neurosurgery (Dr. Conchita Paris) over the phone by Dr. Alanda Slim   Sch Meds:  Scheduled Meds:  acetaminophen  1,000 mg Oral TID   capsaicin   Topical BID   DULoxetine  30 mg Oral Daily   gabapentin  300 mg Oral TID   ibuprofen  600 mg Oral TID   pantoprazole  40 mg Oral Q1200   thyroid  90 mg Oral Daily   Continuous Infusions:   PRN Meds:.clonazepam, HYDROmorphone (DILAUDID) injection, menthol-cetylpyridinium, methocarbamol, ondansetron **OR** ondansetron (ZOFRAN) IV, oxyCODONE, phenol, polyethylene glycol, senna-docusate  Antimicrobials: Anti-infectives (From admission, onward)    Start     Dose/Rate Route Frequency Ordered Stop   08/26/20 1000  remdesivir 100 mg in sodium chloride 0.9 % 100 mL IVPB  Status:  Discontinued       See Hyperspace for full Linked Orders Report.   100 mg 200 mL/hr over 30 Minutes  Intravenous Daily 08/25/20 0145 08/26/20 0744   08/25/20 0600  ceFEPIme (MAXIPIME) 2 g in sodium chloride 0.9 % 100 mL IVPB  Status:  Discontinued        2 g 200 mL/hr over 30 Minutes Intravenous Every 8 hours 08/24/20 2024 08/25/20 0116   08/25/20 0400  cefTRIAXone (ROCEPHIN) 1 g in sodium chloride 0.9 % 100 mL IVPB  Status:  Discontinued        1 g 200 mL/hr over 30 Minutes Intravenous Daily 08/25/20 0116 08/27/20 1358   08/25/20 0230  remdesivir 200 mg in sodium chloride 0.9% 250 mL IVPB  Status:  Discontinued       See Hyperspace for full Linked Orders Report.   200 mg 580 mL/hr over 30 Minutes Intravenous Once 08/25/20 0145 08/26/20 0744   08/24/20 2045  vancomycin (VANCOREADY) IVPB 2000 mg/400 mL  Status:  Discontinued        2,000 mg 200 mL/hr over 120 Minutes Intravenous  Once 08/24/20 2001 08/24/20 2332   08/24/20 2030  ceFEPIme (MAXIPIME) 2 g in sodium chloride 0.9 % 100 mL IVPB  2 g 200 mL/hr over 30 Minutes Intravenous  Once 08/24/20 2015 08/24/20 2119        I have personally reviewed the following labs and images: CBC: Recent Labs  Lab 08/24/20 1834 08/25/20 0330 08/25/20 1336 08/26/20 0208 08/28/20 0546  WBC 6.6 5.1 3.8* 4.6 8.6  NEUTROABS 5.8  --   --   --   --   HGB 13.6 11.4* 12.0 11.5* 11.9*  HCT 39.7 34.5* 35.6* 34.0* 35.4*  MCV 91.7 93.2 91.5 91.6 91.5  PLT 200 183 168 155 151    BMP &GFR Recent Labs  Lab 08/24/20 1834 08/25/20 0330 08/26/20 0208 08/28/20 0546  NA 134* 135 138 138  K 3.8 3.5 3.6 4.2  CL 103 106 108 103  CO2 21* 21* 24 26  GLUCOSE 99 128* 112* 117*  BUN 9 6 8 7   CREATININE 0.89 0.79 0.87 0.78  CALCIUM 9.4 8.2* 8.5* 8.7*  MG  --   --  1.6* 1.7  PHOS  --   --  3.2 3.4    Estimated Creatinine Clearance: 136.1 mL/min (by C-G formula based on SCr of 0.78 mg/dL). Liver & Pancreas: Recent Labs  Lab 08/24/20 1834 08/26/20 0208 08/28/20 0546  AST 25  --   --   ALT 30  --   --   ALKPHOS 78  --   --   BILITOT 0.5  --    --   PROT 6.8  --   --   ALBUMIN 4.2 3.3* 3.3*    No results for input(s): LIPASE, AMYLASE in the last 168 hours. No results for input(s): AMMONIA in the last 168 hours. Diabetic: No results for input(s): HGBA1C in the last 72 hours. No results for input(s): GLUCAP in the last 168 hours. Cardiac Enzymes: No results for input(s): CKTOTAL, CKMB, CKMBINDEX, TROPONINI in the last 168 hours. No results for input(s): PROBNP in the last 8760 hours. Coagulation Profile: Recent Labs  Lab 08/24/20 1834  INR 1.0    Thyroid Function Tests: No results for input(s): TSH, T4TOTAL, FREET4, T3FREE, THYROIDAB in the last 72 hours. Lipid Profile: No results for input(s): CHOL, HDL, LDLCALC, TRIG, CHOLHDL, LDLDIRECT in the last 72 hours. Anemia Panel: Recent Labs    08/25/20 1336  VITAMINB12 192  FOLATE 10.9  FERRITIN 57  TIBC 309  IRON 43  RETICCTPCT 1.2    Urine analysis:    Component Value Date/Time   COLORURINE YELLOW 08/24/2020 1834   APPEARANCEUR HAZY (A) 08/24/2020 1834   LABSPEC 1.014 08/24/2020 1834   PHURINE 8.0 08/24/2020 1834   GLUCOSEU NEGATIVE 08/24/2020 1834   HGBUR MODERATE (A) 08/24/2020 1834   BILIRUBINUR NEGATIVE 08/24/2020 1834   KETONESUR 5 (A) 08/24/2020 1834   PROTEINUR NEGATIVE 08/24/2020 1834   NITRITE POSITIVE (A) 08/24/2020 1834   LEUKOCYTESUR NEGATIVE 08/24/2020 1834   Sepsis Labs: Invalid input(s): PROCALCITONIN, LACTICIDVEN  Microbiology: Recent Results (from the past 240 hour(s))  Urine culture     Status: Abnormal   Collection Time: 08/24/20  6:34 PM   Specimen: In/Out Cath Urine  Result Value Ref Range Status   Specimen Description IN/OUT CATH URINE  Final   Special Requests   Final    NONE Performed at [facility], [address] [place], [place] [address]    Culture >=100,000 COLONIES/mL ESCHERICHIA COLI (A)  Final   Report Status 08/27/2020 FINAL  Final   Organism ID, Bacteria ESCHERICHIA COLI (A)  Final  Susceptibility    Escherichia coli - MIC*    AMPICILLIN <=2 SENSITIVE Sensitive     CEFAZOLIN <=4 SENSITIVE Sensitive     CEFEPIME <=0.12 SENSITIVE Sensitive     CEFTRIAXONE <=0.25 SENSITIVE Sensitive     CIPROFLOXACIN <=0.25 SENSITIVE Sensitive     GENTAMICIN <=1 SENSITIVE Sensitive     IMIPENEM <=0.25 SENSITIVE Sensitive     NITROFURANTOIN <=16 SENSITIVE Sensitive     TRIMETH/SULFA <=20 SENSITIVE Sensitive     AMPICILLIN/SULBACTAM <=2 SENSITIVE Sensitive     PIP/TAZO <=4 SENSITIVE Sensitive     * >=100,000 COLONIES/mL ESCHERICHIA COLI  Blood Culture (routine x 2)     Status: None (Preliminary result)   Collection Time: 08/24/20  6:40 PM   Specimen: BLOOD RIGHT ARM  Result Value Ref Range Status   Specimen Description BLOOD RIGHT ARM  Final   Special Requests   Final    BOTTLES DRAWN AEROBIC AND ANAEROBIC Blood Culture adequate volume   Culture   Final    NO GROWTH 4 DAYS Performed at [facility], [address], [place], [place] [address]    Report Status PENDING  Incomplete  Resp Panel by RT-PCR (Flu A&B, Covid) Nasopharyngeal Swab     Status: Abnormal   Collection Time: 08/24/20  8:01 PM   Specimen: Nasopharyngeal Swab; Nasopharyngeal(NP) swabs in vial transport medium  Result Value Ref Range Status   SARS Coronavirus 2 by RT PCR POSITIVE (A) NEGATIVE Final    Comment: RESULT CALLED TO, READ BACK BY AND VERIFIED WITH: J MOOREFIELD RN 08/24/20 2200 JDW (NOTE) SARS-CoV-2 target nucleic acids are DETECTED.  The SARS-CoV-2 RNA is generally detectable in upper respiratory specimens during the acute phase of infection. Positive results are indicative of the presence of the identified virus, but do not rule out bacterial infection or co-infection with other pathogens not detected by the test. Clinical correlation with patient history and other diagnostic information is necessary to determine patient infection status. The expected result is Negative.  Fact Sheet for  Patients: BloggerCourse.com  Fact Sheet for Healthcare Providers: SeriousBroker.it  This test is not yet approved or cleared by the [place] FDA and  has been authorized for detection and/or diagnosis of SARS-CoV-2 by FDA under an Emergency Use Authorization (EUA).  This EUA will remain in effect (meaning this test can be  used) for the duration of  the COVID-19 declaration under Section 564(b)(1) of the Act, 21 U.S.C. section 360bbb-3(b)(1), unless the authorization is terminated or revoked sooner.     Influenza A by PCR NEGATIVE NEGATIVE Final   Influenza B by PCR NEGATIVE NEGATIVE Final    Comment: (NOTE) The Xpert Xpress SARS-CoV-2/FLU/RSV plus assay is intended as an aid in the diagnosis of influenza from Nasopharyngeal swab specimens and should not be used as a sole basis for treatment. Nasal washings and aspirates are unacceptable for Xpert Xpress SARS-CoV-2/FLU/RSV testing.  Fact Sheet for Patients: BloggerCourse.com  Fact Sheet for Healthcare Providers: SeriousBroker.it  This test is not yet approved or cleared by the [place] FDA and has been authorized for detection and/or diagnosis of SARS-CoV-2 by FDA under an Emergency Use Authorization (EUA). This EUA will remain in effect (meaning this test can be used) for the duration of the COVID-19 declaration under Section 564(b)(1) of the Act, 21 U.S.C. section 360bbb-3(b)(1), unless the authorization is terminated or revoked.  Performed at [facility], [address], [place], [place] [address]   Blood Culture (routine x  2)     Status: None (Preliminary result)   Collection Time: 08/24/20  8:04 PM   Specimen: BLOOD  Result Value Ref Range Status   Specimen Description BLOOD RIGHT ANTECUBITAL  Final   Special Requests   Final    BOTTLES DRAWN AEROBIC AND ANAEROBIC Blood Culture adequate volume    Culture   Final    NO GROWTH 4 DAYS Performed at [facility], [address] [place], [place] [address]    Report Status PENDING  Incomplete    Radiology Studies: No results found.     Taye T. Gonfa [facility]  If 7PM-7AM, please contact night-coverage [email or web address] 08/28/2020, 10:22 AM

## 2020-08-29 LAB — CULTURE, BLOOD (ROUTINE X 2)
Culture: NO GROWTH
Culture: NO GROWTH
Special Requests: ADEQUATE
Special Requests: ADEQUATE

## 2020-08-29 MED ORDER — POLYETHYLENE GLYCOL 3350 17 G PO PACK: 17.0000 g | PACK | Freq: Two times a day (BID) | ORAL | 0 refills | Status: DC | PRN

## 2020-08-29 MED ORDER — CAPSAICIN 0.025 % EX CREA: TOPICAL_CREAM | Freq: Two times a day (BID) | CUTANEOUS | 0 refills | Status: DC

## 2020-08-29 MED ORDER — OXYCODONE HCL 10 MG PO TABS: 10.0000 mg | ORAL_TABLET | Freq: Four times a day (QID) | ORAL | 0 refills | Status: DC | PRN

## 2020-08-29 MED ORDER — METHOCARBAMOL 750 MG PO TABS: 750.0000 mg | ORAL_TABLET | Freq: Three times a day (TID) | ORAL | 0 refills | Status: DC | PRN

## 2020-08-29 MED ORDER — ACETAMINOPHEN 500 MG PO TABS: 1000.0000 mg | ORAL_TABLET | Freq: Three times a day (TID) | ORAL | 0 refills | Status: DC

## 2020-08-29 MED ORDER — IBUPROFEN 800 MG PO TABS: 800.0000 mg | ORAL_TABLET | Freq: Three times a day (TID) | ORAL | 0 refills | Status: DC | PRN

## 2020-08-29 MED ORDER — GABAPENTIN 300 MG PO CAPS: 300.0000 mg | ORAL_CAPSULE | Freq: Three times a day (TID) | ORAL | 0 refills | Status: DC

## 2020-08-29 MED ORDER — DULOXETINE HCL 30 MG PO CPEP: 30.0000 mg | ORAL_CAPSULE | Freq: Every day | ORAL | 0 refills | Status: DC

## 2020-08-29 MED ORDER — PANTOPRAZOLE SODIUM 40 MG PO TBEC: 40.0000 mg | DELAYED_RELEASE_TABLET | Freq: Every day | ORAL | 0 refills | Status: DC

## 2020-08-29 NOTE — Discharge Summary (Signed)
PATIENT DETAILS Name: Yvonne Little Age: 25 y.o. Sex: female Date of Birth: 1995/09/08 MRN: [id number]. Admitting Physician: Almon Hercules, MD WUJ:WJXBJ, Jimmy Footman  Admit Date: 08/24/2020 Discharge date: 08/29/2020  Recommendations for Outpatient Follow-up:  Follow up with PCP in 1-2 weeks Please obtain CMP/CBC in one week Please follow up with patient's primary orthopedic MD  Admitted From:  Home  Disposition: Home   Home Health: No  Equipment/Devices: None  Discharge Condition: Stable  CODE STATUS: FULL CODE  Diet recommendation:  Diet Order             Diet general           Diet regular Room service appropriate? Yes; Fluid consistency: Thin  Diet effective now                   Brief Narrative 25 year old F with PMH of complex regional pain syndrome, anxiety, depression and hypothyroidism presenting to ED with acute lower back pain and fever.  Patient has left knee injury at work for which she follows up with [facility] in [place].  She had ongoing knee pain that was not controlled with p.o. medication and local injection.  As a result, she underwent lumbar sympathetic ganglion nerve block on 08/22/2020, and has been experiencing severe pain at injection on her left lower back site since then.  She also had a fever the morning of presentation.  She was subsequently admitted to the hospitalist service for further evaluation and treatment.  MRI of the lumbar spine was negative for any acute abnormalities-there was no signs of any infection.  She was also found to be COVID-19 positive.      Brief Hospital Course: Intractable back pain: Pain overall much better-around 5/10 this morning-seems to be well controlled with scheduled Tylenol/NSAIDs/Neurontin/Robaxin and as needed narcotics.  No concerning findings on exam-MRI of her lumbosacral spine without any major/acute findings.  Pain seemed somewhat out of proportion to clinical situation.  In any event-since  her pain is relatively well controlled with oral regimen-she is stable to be discharged today and have her follow-up with her outpatient orthopedic MD.  This MD went through her medication plan-she understands that she is not supposed to take more than 4000 mg of Tylenol a day-and understands that she could alternate between Tylenol and Motrin.  She is to minimize use of narcotics as much as possible and only to use it for severe breakthrough pain. Please note-numerous subspecialties were consulted over the phone by prior MD Dr. Lockie Pares from supportive care-no further measures were recommended.   COVID-19 infection: Currently asymptomatic-she is unvaccinated-she declined Remdesivir.  She was encouraged to get vaccinated post discharge.   E. coli UTI: Completed treatment with 3 days of Rocephin.  Clinical features not consistent with pyelonephritis.   Normocytic anemia: H&H relatively stable.  Anemia panel basically normal. Recent Labs (within last 365 days)         Recent Labs    08/24/20 1834 08/25/20 0330 08/25/20 1336 08/26/20 0208 08/28/20 0546  HGB 13.6 11.4* 12.0 11.5* 11.9*       Sinus tachycardia: Likely autonomic response from pain.  Resolved.   Fever: Likely due to COVID-19 infection +/- UTI.  See above.   Hypomagnesemia: Repleted.   Anxiety/depression: Given her pain issues-we will switch Paxil to Cymbalta-patient was agreeable with this plan-subsequently spoke with pharmacist-no need to taper Paxil as she will be on Cymbalta.  Continue with as needed Klonopin.   Hypothyroidism:Continue Armour thyroid  Leukopenia: Resolved.  Likely due to COVID-19 infection.   Class I obesity Body mass index is 35.7 kg/m.  -Encourage lifestyle change to lose weight.    Procedures None  Discharge Diagnoses:  Principal Problem:   Intractable back pain Active Problems:   Subclinical hypothyroidism   Adjustment disorder with anxiety   COVID   UTI (urinary tract  infection)   Discharge Instructions:  Activity:  As tolerated   Discharge Instructions     Diet general   Complete by: As directed    Discharge instructions   Complete by: As directed    Follow with Primary MD  [name], PA-C in 1-2 weeks  Please follow-up with your primary orthopedic MD in the next few days  Please get a complete blood count and chemistry panel checked by your Primary MD at your next visit, and again as instructed by your Primary MD.  Get Medicines reviewed and adjusted: Please take all your medications with you for your next visit with your Primary MD  Laboratory/radiological data: Please request your Primary MD to go over all hospital tests and procedure/radiological results at the follow up, please ask your Primary MD to get all Hospital records sent to his/her office.  In some cases, they will be blood work, cultures and biopsy results pending at the time of your discharge. Please request that your primary care M.D. follows up on these results.  Also Note the following: If you experience worsening of your admission symptoms, develop shortness of breath, life threatening emergency, suicidal or homicidal thoughts you must seek medical attention immediately by calling 911 or calling your MD immediately  if symptoms less severe.  You must read complete instructions/literature along with all the possible adverse reactions/side effects for all the Medicines you take and that have been prescribed to you. Take any new Medicines after you have completely understood and accpet all the possible adverse reactions/side effects.   Do not drive when taking Pain medications or sleeping medications (Benzodaizepines)  Do not take more than prescribed Pain, Sleep and Anxiety Medications. It is not advisable to combine anxiety,sleep and pain medications without talking with your primary care practitioner  Special Instructions: If you have smoked or chewed Tobacco  in the  last 2 yrs please stop smoking, stop any regular Alcohol  and or any Recreational drug use.  Wear Seat belts while driving.  Please note: You were cared for by a hospitalist during your hospital stay. Once you are discharged, your primary care physician will handle any further medical issues. Please note that NO REFILLS for any discharge medications will be authorized once you are discharged, as it is imperative that you return to your primary care physician (or establish a relationship with a primary care physician if you do not have one) for your post hospital discharge needs so that they can reassess your need for medications and monitor your lab values.   Increase activity slowly   Complete by: As directed       Allergies as of 08/29/2020       Reactions   Escitalopram Other (See Comments)   Altered mental status.   Levothyroxine Nausea Only   Lidocaine Hives, Rash        Medication List     STOP taking these medications    busPIRone 5 MG tablet Commonly known as: BUSPAR   levothyroxine 100 MCG tablet Commonly known as: SYNTHROID   trimethoprim-polymyxin b ophthalmic solution Commonly known as: Polytrim   zolpidem 5  MG tablet Commonly known as: AMBIEN       TAKE these medications    acetaminophen 500 MG tablet Commonly known as: TYLENOL Take 2 tablets (1,000 mg total) by mouth 3 (three) times daily.   Armour Thyroid 90 MG tablet Generic drug: thyroid Take 90 mg by mouth daily.   capsaicin 0.025 % cream Commonly known as: ZOSTRIX Apply topically 2 (two) times daily.   clonazePAM 0.5 MG tablet Commonly known as: KLONOPIN Take 0.25 mg by mouth daily as needed for anxiety.   cyanocobalamin 1000 MCG/ML injection Commonly known as: (VITAMIN B-12) Inject 1,000 mcg into the muscle every 30 (thirty) days.   DULoxetine 30 MG capsule Commonly known as: CYMBALTA Take 1 capsule (30 mg total) by mouth daily.   gabapentin 300 MG capsule Commonly known as:  NEURONTIN Take 1 capsule (300 mg total) by mouth 3 (three) times daily.   ibuprofen 800 MG tablet Commonly known as: ADVIL Take 1 tablet (800 mg total) by mouth every 8 (eight) hours as needed.   methocarbamol 750 MG tablet Commonly known as: ROBAXIN Take 1 tablet (750 mg total) by mouth every 8 (eight) hours as needed for muscle spasms.   Oxycodone HCl 10 MG Tabs Take 1 tablet (10 mg total) by mouth every 6 (six) hours as needed for moderate pain.   pantoprazole 40 MG tablet Commonly known as: PROTONIX Take 1 tablet (40 mg total) by mouth daily at 12 noon.   polyethylene glycol 17 g packet Commonly known as: MIRALAX / GLYCOLAX Take 17 g by mouth 2 (two) times daily as needed for mild constipation.        Follow-up Information     Danelle Berryapia, Leisa, PA-C. Schedule an appointment as soon as possible for a visit in 1 week(s).   Specialty: Family Medicine Contact information: [address] [place] [address] [phone]         Your primary orthopedic MD. Schedule an appointment as soon as possible for a visit in 2 day(s).                 Allergies  Allergen Reactions   Escitalopram Other (See Comments)    Altered mental status.    Levothyroxine Nausea Only   Lidocaine Hives and Rash      Consultations: Orthopedic surgery PA over the phone by Dr. Alanda SlimGonfa Anesthesia over the phone by Dr. Alanda SlimGonfa Dr. Donette Larryoone at [facility] in [place] by Dr. Alanda SlimGonfa Neurosurgery (Dr. Conchita ParisNundkumar) over the phone by Dr. Alanda SlimGonfa   Other Procedures/Studies: MR Lumbar Spine W Wo Contrast  Result Date: 08/24/2020 CLINICAL DATA:  Trauma.  Concern for epidural abscess. EXAM: MRI LUMBAR SPINE WITHOUT AND WITH CONTRAST TECHNIQUE: Multiplanar and multiecho pulse sequences of the lumbar spine were obtained without and with intravenous contrast. CONTRAST:  10mL GADAVIST GADOBUTROL 1 MMOL/ML IV SOLN COMPARISON:  None. FINDINGS: Segmentation:  Standard. Alignment:  Physiologic.  Vertebrae: No fracture, evidence of discitis, or bone lesion. No epidural collection Conus medullaris and cauda equina: Conus extends to the L1 level. Conus and cauda equina appear normal. Paraspinal and other soft tissues: Negative Disc levels: The disc levels from T12-L5 are normal. L5-S1: Small central disc protrusion.  No spinal canal stenosis. No abnormal contrast enhancement. IMPRESSION: 1. No epidural abscess or other acute abnormality of the lumbar spine. 2. Small central disc protrusion at L5-S1 without spinal canal stenosis. Electronically Signed   By: Deatra RobinsonKevin  Herman M.D.   On: 08/24/2020 21:57     TODAY-DAY  OF DISCHARGE:  Subjective:   Tenia Goh today has no headache,no chest abdominal pain,no new weakness tingling or numbness, feels much better wants to go home today.  Patient was seen and examined this morning with chaperone [employer].  Objective:   Blood pressure 100/62, pulse 90, temperature 98.7 F (37.1 C), temperature source Oral, resp. rate 14, height 5\' 8"  (1.727 m), weight 106.5 kg, SpO2 98 %. No intake or output data in the 24 hours ending 08/29/20 0928 Filed Weights   08/24/20 1942 08/25/20 1700  Weight: 99.8 kg 106.5 kg    Exam: Awake Alert, Oriented *3, No new F.N deficits, Normal affect [place].AT,PERRAL Supple Neck,No JVD, No cervical lymphadenopathy appriciated.  Symmetrical Chest wall movement, Good air movement bilaterally, CTAB RRR,No Gallops,Rubs or new Murmurs, No Parasternal Heave +ve B.Sounds, Abd Soft, Non tender, No organomegaly appriciated, No rebound -guarding or rigidity. No Cyanosis, Clubbing or edema, No new Rash or bruise   PERTINENT RADIOLOGIC STUDIES: No results found.   PERTINENT LAB RESULTS: CBC: Recent Labs    08/28/20 0546  WBC 8.6  HGB 11.9*  HCT 35.4*  PLT 151   CMET CMP     Component Value Date/Time   NA 138 08/28/2020 0546   K 4.2 08/28/2020 0546   CL 103 08/28/2020 0546   CO2 26 08/28/2020 0546   GLUCOSE 117 (H)  08/28/2020 0546   BUN 7 08/28/2020 0546   CREATININE 0.78 08/28/2020 0546   CREATININE 0.80 12/01/2018 0000   CALCIUM 8.7 (L) 08/28/2020 0546   PROT 6.8 08/24/2020 1834   ALBUMIN 3.3 (L) 08/28/2020 0546   AST 25 08/24/2020 1834   ALT 30 08/24/2020 1834   ALKPHOS 78 08/24/2020 1834   BILITOT 0.5 08/24/2020 1834   GFRNONAA >60 08/28/2020 0546   GFRNONAA 103 12/01/2018 0000   GFRAA 120 12/01/2018 0000    GFR Estimated Creatinine Clearance: 136.1 mL/min (by C-G formula based on SCr of 0.78 mg/dL). No results for input(s): LIPASE, AMYLASE in the last 72 hours. No results for input(s): CKTOTAL, CKMB, CKMBINDEX, TROPONINI in the last 72 hours. Invalid input(s): POCBNP No results for input(s): DDIMER in the last 72 hours. No results for input(s): HGBA1C in the last 72 hours. No results for input(s): CHOL, HDL, LDLCALC, TRIG, CHOLHDL, LDLDIRECT in the last 72 hours. No results for input(s): TSH, T4TOTAL, T3FREE, THYROIDAB in the last 72 hours.  Invalid input(s): FREET3 No results for input(s): VITAMINB12, FOLATE, FERRITIN, TIBC, IRON, RETICCTPCT in the last 72 hours. Coags: No results for input(s): INR in the last 72 hours.  Invalid input(s): PT Microbiology: Recent Results (from the past 240 hour(s))  Urine culture     Status: Abnormal   Collection Time: 08/24/20  6:34 PM   Specimen: In/Out Cath Urine  Result Value Ref Range Status   Specimen Description IN/OUT CATH URINE  Final   Special Requests   Final    NONE Performed at [facility], [address] [place], [place]    Culture >=100,000 COLONIES/mL ESCHERICHIA COLI (A)  Final   Report Status 08/27/2020 FINAL  Final   Organism ID, Bacteria ESCHERICHIA COLI (A)  Final      Susceptibility   Escherichia coli - MIC*    AMPICILLIN <=2 SENSITIVE Sensitive     CEFAZOLIN <=4 SENSITIVE Sensitive     CEFEPIME <=0.12 SENSITIVE Sensitive     CEFTRIAXONE <=0.25 SENSITIVE Sensitive     CIPROFLOXACIN <=0.25 SENSITIVE  Sensitive     GENTAMICIN <=  1 SENSITIVE Sensitive     IMIPENEM <=0.25 SENSITIVE Sensitive     NITROFURANTOIN <=16 SENSITIVE Sensitive     TRIMETH/SULFA <=20 SENSITIVE Sensitive     AMPICILLIN/SULBACTAM <=2 SENSITIVE Sensitive     PIP/TAZO <=4 SENSITIVE Sensitive     * >=100,000 COLONIES/mL ESCHERICHIA COLI  Blood Culture (routine x 2)     Status: None   Collection Time: 08/24/20  6:40 PM   Specimen: BLOOD RIGHT ARM  Result Value Ref Range Status   Specimen Description BLOOD RIGHT ARM  Final   Special Requests   Final    BOTTLES DRAWN AEROBIC AND ANAEROBIC Blood Culture adequate volume   Culture   Final    NO GROWTH 5 DAYS Performed at [facility], [address] [place], [place] [address]    Report Status 08/29/2020 FINAL  Final  Resp Panel by RT-PCR (Flu A&B, Covid) Nasopharyngeal Swab     Status: Abnormal   Collection Time: 08/24/20  8:01 PM   Specimen: Nasopharyngeal Swab; Nasopharyngeal(NP) swabs in vial transport medium  Result Value Ref Range Status   SARS Coronavirus 2 by RT PCR POSITIVE (A) NEGATIVE Final    Comment: RESULT CALLED TO, READ BACK BY AND VERIFIED WITH: J MOOREFIELD RN 08/24/20 2200 JDW (NOTE) SARS-CoV-2 target nucleic acids are DETECTED.  The SARS-CoV-2 RNA is generally detectable in upper respiratory specimens during the acute phase of infection. Positive results are indicative of the presence of the identified virus, but do not rule out bacterial infection or co-infection with other pathogens not detected by the test. Clinical correlation with patient history and other diagnostic information is necessary to determine patient infection status. The expected result is Negative.  Fact Sheet for Patients: BloggerCourse.com  Fact Sheet for Healthcare Providers: SeriousBroker.it  This test is not yet approved or cleared by the [place] FDA and  has been authorized for detection and/or diagnosis  of SARS-CoV-2 by FDA under an Emergency Use Authorization (EUA).  This EUA will remain in effect (meaning this test can be  used) for the duration of  the COVID-19 declaration under Section 564(b)(1) of the Act, 21 U.S.C. section 360bbb-3(b)(1), unless the authorization is terminated or revoked sooner.     Influenza A by PCR NEGATIVE NEGATIVE Final   Influenza B by PCR NEGATIVE NEGATIVE Final    Comment: (NOTE) The Xpert Xpress SARS-CoV-2/FLU/RSV plus assay is intended as an aid in the diagnosis of influenza from Nasopharyngeal swab specimens and should not be used as a sole basis for treatment. Nasal washings and aspirates are unacceptable for Xpert Xpress SARS-CoV-2/FLU/RSV testing.  Fact Sheet for Patients: BloggerCourse.com  Fact Sheet for Healthcare Providers: SeriousBroker.it  This test is not yet approved or cleared by the [place] FDA and has been authorized for detection and/or diagnosis of SARS-CoV-2 by FDA under an Emergency Use Authorization (EUA). This EUA will remain in effect (meaning this test can be used) for the duration of the COVID-19 declaration under Section 564(b)(1) of the Act, 21 U.S.C. section 360bbb-3(b)(1), unless the authorization is terminated or revoked.  Performed at [facility], [address], [place], [place] [address]   Blood Culture (routine x 2)     Status: None   Collection Time: 08/24/20  8:04 PM   Specimen: BLOOD  Result Value Ref Range Status   Specimen Description BLOOD RIGHT ANTECUBITAL  Final   Special Requests   Final    BOTTLES DRAWN AEROBIC AND ANAEROBIC Blood Culture adequate volume  Culture   Final    NO GROWTH 5 DAYS Performed at [facility], [address], [place], [place] [address]    Report Status 08/29/2020 FINAL  Final    FURTHER DISCHARGE INSTRUCTIONS:  Get Medicines reviewed and adjusted: Please take all your medications with you for your  next visit with your Primary MD  Laboratory/radiological data: Please request your Primary MD to go over all hospital tests and procedure/radiological results at the follow up, please ask your Primary MD to get all Hospital records sent to his/her office.  In some cases, they will be blood work, cultures and biopsy results pending at the time of your discharge. Please request that your primary care M.D. goes through all the records of your hospital data and follows up on these results.  Also Note the following: If you experience worsening of your admission symptoms, develop shortness of breath, life threatening emergency, suicidal or homicidal thoughts you must seek medical attention immediately by calling 911 or calling your MD immediately  if symptoms less severe.  You must read complete instructions/literature along with all the possible adverse reactions/side effects for all the Medicines you take and that have been prescribed to you. Take any new Medicines after you have completely understood and accpet all the possible adverse reactions/side effects.   Do not drive when taking Pain medications or sleeping medications (Benzodaizepines)  Do not take more than prescribed Pain, Sleep and Anxiety Medications. It is not advisable to combine anxiety,sleep and pain medications without talking with your primary care practitioner  Special Instructions: If you have smoked or chewed Tobacco  in the last 2 yrs please stop smoking, stop any regular Alcohol  and or any Recreational drug use.  Wear Seat belts while driving.  Please note: You were cared for by a hospitalist during your hospital stay. Once you are discharged, your primary care physician will handle any further medical issues. Please note that NO REFILLS for any discharge medications will be authorized once you are discharged, as it is imperative that you return to your primary care physician (or establish a relationship with a primary care  physician if you do not have one) for your post hospital discharge needs so that they can reassess your need for medications and monitor your lab values.  Total Time spent coordinating discharge including counseling, education and face to face time equals 35 minutes  Signed: Jeoffrey Massed 08/29/2020 9:28 AM

## 2020-09-02 ENCOUNTER — Other Ambulatory Visit: Payer: Self-pay

## 2020-09-02 ENCOUNTER — Emergency Department (HOSPITAL_BASED_OUTPATIENT_CLINIC_OR_DEPARTMENT_OTHER)
Admission: EM | Admit: 2020-09-02 | Discharge: 2020-09-02 | Disposition: A | Discharge status: Home / Self Care | Payer: 59 | Attending: Emergency Medicine | Admitting: Emergency Medicine

## 2020-09-02 ENCOUNTER — Encounter (HOSPITAL_BASED_OUTPATIENT_CLINIC_OR_DEPARTMENT_OTHER): Payer: Self-pay | Admitting: Emergency Medicine

## 2020-09-02 DIAGNOSIS — R197 Diarrhea, unspecified: Secondary | ICD-10-CM | POA: Diagnosis present

## 2020-09-02 DIAGNOSIS — E039 Hypothyroidism, unspecified: Secondary | ICD-10-CM | POA: Diagnosis not present

## 2020-09-02 DIAGNOSIS — R531 Weakness: Secondary | ICD-10-CM | POA: Insufficient documentation

## 2020-09-02 DIAGNOSIS — R10817 Generalized abdominal tenderness: Secondary | ICD-10-CM | POA: Insufficient documentation

## 2020-09-02 DIAGNOSIS — E860 Dehydration: Secondary | ICD-10-CM

## 2020-09-02 DIAGNOSIS — Z79899 Other long term (current) drug therapy: Secondary | ICD-10-CM | POA: Insufficient documentation

## 2020-09-02 DIAGNOSIS — R112 Nausea with vomiting, unspecified: Secondary | ICD-10-CM

## 2020-09-02 DIAGNOSIS — R110 Nausea: Secondary | ICD-10-CM | POA: Diagnosis not present

## 2020-09-02 LAB — URINALYSIS, ROUTINE W REFLEX MICROSCOPIC
Bilirubin Urine: NEGATIVE
Glucose, UA: NEGATIVE mg/dL
Ketones, ur: >80 mg/dL — AB
Nitrite: NEGATIVE
Protein, ur: NEGATIVE mg/dL
Specific Gravity, Urine: 1.013 (ref 1.005–1.030)
pH: 5.5 (ref 5.0–8.0)

## 2020-09-02 LAB — BASIC METABOLIC PANEL
Anion gap: 13 (ref 5–15)
BUN: 13 mg/dL (ref 6–20)
CO2: 24 mmol/L (ref 22–32)
Calcium: 10 mg/dL (ref 8.9–10.3)
Chloride: 101 mmol/L (ref 98–111)
Creatinine, Ser: 0.77 mg/dL (ref 0.44–1.00)
GFR, Estimated: >60 mL/min (ref >60)
Glucose, Bld: 89 mg/dL (ref 70–99)
Potassium: 4 mmol/L (ref 3.5–5.1)
Sodium: 138 mmol/L (ref 135–145)

## 2020-09-02 LAB — PREGNANCY, URINE: Preg Test, Ur: NEGATIVE

## 2020-09-02 LAB — CBC
HCT: 43.7 % (ref 36.0–46.0)
Hemoglobin: 15.1 g/dL — ABNORMAL HIGH (ref 12.0–15.0)
MCH: 30.6 pg (ref 26.0–34.0)
MCHC: 34.6 g/dL (ref 30.0–36.0)
MCV: 88.5 fL (ref 80.0–100.0)
Platelets: 289 10*3/uL (ref 150–400)
RBC: 4.94 MIL/uL (ref 3.87–5.11)
RDW: 12.3 % (ref 11.5–15.5)
WBC: 5.9 10*3/uL (ref 4.0–10.5)
nRBC: 0 % (ref 0.0–0.2)

## 2020-09-02 LAB — CBG MONITORING, ED: Glucose-Capillary: 84 mg/dL (ref 70–99)

## 2020-09-02 MED ORDER — PROCHLORPERAZINE MALEATE 10 MG PO TABS: 10.0000 mg | ORAL_TABLET | Freq: Two times a day (BID) | ORAL | 0 refills | Status: DC | PRN

## 2020-09-02 MED ORDER — ONDANSETRON 4 MG PO TBDP: 4.0000 mg | ORAL_TABLET | Freq: Three times a day (TID) | ORAL | 0 refills | Status: DC | PRN

## 2020-09-02 MED ORDER — DIPHENHYDRAMINE HCL 50 MG/ML IJ SOLN
25.0000 mg | Freq: Once | INTRAMUSCULAR | Status: AC
  Administered 2020-09-02: 25 mg via INTRAVENOUS
  Filled 2020-09-02: qty 1

## 2020-09-02 MED ORDER — SODIUM CHLORIDE 0.9 % IV BOLUS
1000.0000 mL | Freq: Once | INTRAVENOUS | Status: AC
  Administered 2020-09-02: 1000 mL via INTRAVENOUS

## 2020-09-02 MED ORDER — LACTATED RINGERS IV BOLUS
1000.0000 mL | Freq: Once | INTRAVENOUS | Status: AC
  Administered 2020-09-02: 1000 mL via INTRAVENOUS

## 2020-09-02 MED ORDER — METOCLOPRAMIDE HCL 5 MG/ML IJ SOLN
10.0000 mg | Freq: Once | INTRAMUSCULAR | Status: AC
  Administered 2020-09-02: 10 mg via INTRAVENOUS
  Filled 2020-09-02: qty 2

## 2020-09-02 MED ORDER — ONDANSETRON HCL 4 MG/2ML IJ SOLN
4.0000 mg | Freq: Once | INTRAMUSCULAR | Status: AC
  Administered 2020-09-02: 4 mg via INTRAVENOUS
  Filled 2020-09-02: qty 2

## 2020-09-02 NOTE — ED Notes (Signed)
Patient now calm; regular breathing and resting.

## 2020-09-02 NOTE — ED Notes (Signed)
Patient drinking water for PO challenge  

## 2020-09-02 NOTE — ED Provider Notes (Signed)
  Physical Exam  BP (!) 112/59 (BP Location: Left Arm)   Pulse 75   Temp 98.4 F (36.9 C) (Oral)   Resp 16   SpO2 97%   Physical Exam  ED Course/Procedures   Clinical Course as of 09/02/20 1849  Fri Sep 02, 2020  1548 CBC and BMP are normal. Patient still having vomiting, given reglan and then benadryl for akathisia. Care of the patient signed out to Dr. Rubin Payor at the change of shift.  [CS]    Clinical Course User Index [CS] Pollyann Savoy, MD    Procedures  MDM  Patient with nausea vomiting diarrhea.  Labs reassuring.  Urine does show some dehydration however.  Feeling somewhat better after IV fluids.  Sleeping more comfortably.  Has tolerated orals.  Urine shows ketones and was given another liter of fluid.  Discharge home with outpatient follow-up.       Benjiman Core, MD 09/02/20 [phone]

## 2020-09-02 NOTE — ED Provider Notes (Signed)
[facility] EMERGENCY DEPARTMENT Provider Note  CSN: [id number] Arrival date & time: 09/02/20 1156    History Chief Complaint  Patient presents with   Diarrhea   Weakness    Yvonne Little is a 25 y.o. female with history of complex regional pain syndrome had a bupivacaine injection into her lumbar sympathetic ganglion on 6/27, seen in the ED 2 days later for severe back pain and fever. Admitted for further evaluation, MRI was neg for epidural abscess and Covid was positive. She was treated for UTI and ultimately discharged on 7/4. She reports she has had watery diarrhea since then with dry heaves. She has been taking pepto bismol and noticed some black stools recently. No red blood. No vomiting. Diffuse abdominal discomfort. She has been feeling very weak the last few days and had to be helped out of the car today.    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 No pertinent surgical history.  Family History  Problem Relation Age of Onset   Hyperlipidemia Mother    Hypertension Maternal Grandmother    Hyperlipidemia Maternal Grandmother    Hypertension Maternal Grandfather    Hyperlipidemia Maternal Grandfather    Stroke Maternal Grandfather    Diabetes Paternal Grandmother    Non-Hodgkin's lymphoma Paternal Grandmother    Melanoma Paternal Grandfather     Social History   Tobacco Use   Smoking status: Never   Smokeless tobacco: Never  Vaping Use   Vaping Use: Every day   Substances: Nicotine  Substance Use Topics   Alcohol use: Not Currently   Drug use: Never     Home Medications Prior to Admission medications   Medication Sig Start Date End Date Taking? Authorizing Provider  acetaminophen (TYLENOL) 500 MG tablet Take 2 tablets (1,000 mg total)  by mouth 3 (three) times daily. 08/29/20   Ghimire, Werner Lean, MD  ARMOUR THYROID 90 MG tablet Take 90 mg by mouth daily. 03/26/20   [provider]  capsaicin (ZOSTRIX) 0.025 % cream Apply topically 2 (two) times daily. 08/29/20   Ghimire, Werner Lean, MD  clonazePAM (KLONOPIN) 0.5 MG tablet Take 0.25 mg by mouth daily as needed for anxiety. 08/02/20   [provider]  cyanocobalamin (,VITAMIN B-12,) 1000 MCG/ML injection Inject 1,000 mcg into the muscle every 30 (thirty) days. 08/22/20   [provider]  DULoxetine (CYMBALTA) 30 MG capsule Take 1 capsule (30 mg total) by mouth daily. 08/29/20   Ghimire, Werner Lean, MD  gabapentin (NEURONTIN) 300 MG capsule Take 1 capsule (300 mg total) by mouth 3 (three) times daily. 08/29/20   Ghimire, Werner Lean, MD  ibuprofen (ADVIL) 800 MG tablet Take 1 tablet (800 mg total) by mouth every 8 (eight) hours as needed. 08/29/20   Ghimire, Werner Lean, MD  methocarbamol (ROBAXIN) 750 MG tablet Take 1 tablet (750 mg total) by mouth every 8 (eight) hours as needed for muscle spasms. 08/29/20   Ghimire, Werner Lean, MD  Oxycodone HCl 10 MG TABS Take 1 tablet (10 mg total) by mouth every 6 (six) hours as needed. 08/29/20   Ghimire, Werner Lean, MD  pantoprazole (PROTONIX) 40 MG tablet Take 1 tablet (40 mg total) by mouth daily at 12 noon. 08/29/20   Ghimire, Werner Lean, MD  polyethylene glycol (MIRALAX / GLYCOLAX) 17 g packet Take 17 g by mouth 2 (two) times daily as needed for mild constipation. 08/29/20   Ghimire, Werner Lean, MD     Allergies    Escitalopram, Levothyroxine, and Lidocaine   Review of Systems   Review of Systems A comprehensive review of systems was completed and negative except as noted in HPI.    Physical Exam BP (!) 114/94   Pulse 80   Temp 97.6 F (36.4 C) (Oral)   Resp 13   SpO2 96%   Physical Exam Vitals and nursing note reviewed.  Constitutional:      Appearance: Normal appearance.  HENT:     Head: Normocephalic and atraumatic.      Nose: Nose normal.     Mouth/Throat:     Mouth: Mucous membranes are dry.  Eyes:     Extraocular Movements: Extraocular movements intact.     Conjunctiva/sclera: Conjunctivae normal.  Cardiovascular:     Rate and Rhythm: Normal rate.  Pulmonary:     Effort: Pulmonary effort is normal.     Breath sounds: Normal breath sounds.  Abdominal:     General: Abdomen is flat.     Palpations: Abdomen is soft.     Tenderness: There is generalized abdominal tenderness. There is no guarding. Negative signs include Murphy's sign and McBurney's sign.  Musculoskeletal:        General: No swelling. Normal range of motion.     Cervical back: Neck supple.  Skin:    General: Skin is warm and dry.  Neurological:     General: No focal deficit present.     Mental Status: She is alert.  Psychiatric:        Mood and Affect: Mood normal.     ED Results / Procedures / Treatments   Labs (all labs ordered are listed, but only abnormal results are displayed) Labs Reviewed  CBC - Abnormal; Notable for the following components:      Result Value   Hemoglobin 15.1 (*)    All other components within normal limits  URINALYSIS, ROUTINE W REFLEX MICROSCOPIC - Abnormal; Notable for the following components:   APPearance HAZY (*)    Hgb urine dipstick LARGE (*)    Ketones, ur >80 (*)    Leukocytes,Ua TRACE (*)    Bacteria, UA FEW (*)    All other components within normal limits  BASIC METABOLIC PANEL  PREGNANCY, URINE  CBG MONITORING, ED    EKG EKG Interpretation  Date/Time:  Friday September 02 2020 12:32:22 EDT Ventricular Rate:  76 PR Interval:  159 QRS Duration: 82 QT Interval:  398 QTC Calculation: 448 R Axis:   73 Text Interpretation: Sinus rhythm Normal ECG No significant change since last tracing Confirmed by Susy Frizzle [phone]) on 09/02/2020 12:38:32 PM   Radiology No results found.  Procedures Procedures  Medications Ordered in the ED Medications  lactated ringers bolus 1,000 mL  (has no administration in time range)  sodium chloride 0.9 % bolus 1,000 mL (1,000 mLs Intravenous New Bag/Given 09/02/20 1247)  ondansetron (ZOFRAN) injection 4 mg (4 mg Intravenous Given 09/02/20 1247)  metoCLOPramide (REGLAN) injection 10 mg (10 mg Intravenous Given 09/02/20 1434)  diphenhydrAMINE (BENADRYL) injection 25 mg (25 mg Intravenous Given 09/02/20 1457)     MDM Rules/Calculators/A&P MDM Patient with diarrhea and nausea, no vomiting. Appears dehydrated on exam but abdomen without peritoneal signs. Will check labs, give IVF, antiemetics and reassess.   ED Course  I  have reviewed the triage vital signs and the nursing notes.  Pertinent labs & imaging results that were available during my care of the patient were reviewed by me and considered in my medical decision making (see chart for details).  Clinical Course as of 09/02/20 1550  Fri Sep 02, 2020  1548 CBC and BMP are normal. Patient still having vomiting, given reglan and then benadryl for akathisia. Care of the patient signed out to Dr. Rubin Payor at the change of shift.  [CS]    Clinical Course User Index [CS] Pollyann Savoy, MD    Final Clinical Impression(s) / ED Diagnoses Final diagnoses:  None    Rx / DC Orders ED Discharge Orders     None        Pollyann Savoy, MD 09/02/20 1550

## 2020-09-02 NOTE — ED Notes (Signed)
Called to room. Patient states "having panic attack"  States had same in past.  Breathing rapidly and shaking.  MD informed and orders received.  Focused breathing done.   Breathing slowing down and patient more calm.

## 2020-09-02 NOTE — Discharge Instructions (Addendum)
This Zofran can be her first-line for nausea.  The Compazine can be taken if that does not work.  Try and keep her self hydrated.  Follow-up with your doctor as needed.

## 2020-09-02 NOTE — ED Triage Notes (Signed)
Pt arrives to ED with c/o of weakness, lethergy, and fatigue. Pt reports since being d/c from the hospital on 7/4 she has experienced severe constant diarrhea. Pt currently unable to tolerate food or drink.

## 2020-09-26 ENCOUNTER — Ambulatory Visit: Payer: Self-pay | Admitting: *Deleted

## 2020-09-26 NOTE — Telephone Encounter (Signed)
I returned pt's call.   She had Covid the last week of June 2022.  She "had stomach issues that cleared up from that".     For the last 3 weeks she has been having mid upper abd pain especially after she eats with bloating.  The problem seems to be getting worse.   The discomfort kept her up last night.   No diarrhea or vomiting.    See triage notes.  She had a UTI and had antibiotics for it while in the hospital for another issue.   Denies burning or frequency only her urine is darker than normal.   I made her an appt with [name], PA-C for 09/27/2020 at 2:00 in office visit.    I sent my triage notes to [facility] for [name], [place]

## 2020-09-26 NOTE — Telephone Encounter (Signed)
Reason for Disposition  [1] MODERATE pain (e.g., interferes with normal activities) AND [2] pain comes and goes (cramps) AND [3] present > 24 hours  (Exception: pain with Vomiting or Diarrhea - see that Guideline)    Mid upper abd pain after eating for 3 wks with bloating occasionally happening when she has not eaten.   Kept her up last night  Answer Assessment - Initial Assessment Questions 1. LOCATION: "Where does it hurt?"      I returned her call.   It's in the mid section above my belly button all the way across. 2. RADIATION: "Does the pain shoot anywhere else?" (e.g., chest, back)     No 3. ONSET: "When did the pain begin?" (e.g., minutes, hours or days ago)      3 weeks ago 4. SUDDEN: "Gradual or sudden onset?"     Gradually when I would eat.   I had Covid then I started having stomach issues, it got better.   Then 3 wks later I started with the pain after I would eat and bloated.   After every time I eat and sometimes without eating. 5. PATTERN "Does the pain come and go, or is it constant?"    - If constant: "Is it getting better, staying the same, or worsening?"      (Note: Constant means the pain never goes away completely; most serious pain is constant and it progresses)     - If intermittent: "How long does it last?" "Do you have pain now?"     (Note: Intermittent means the pain goes away completely between bouts)     After I eat and sometimes even when I don't eat. I've tried Zofran and Pepto Bismol.  Also protonix and it didn't help either.   No history of stomach problems.  No vomiting or diarrhea. 6. SEVERITY: "How bad is the pain?"  (e.g., Scale 1-10; mild, moderate, or severe)   - MILD (1-3): doesn't interfere with normal activities, abdomen soft and not tender to touch    - MODERATE (4-7): interferes with normal activities or awakens from sleep, abdomen tender to touch    - SEVERE (8-10): excruciating pain, doubled over, unable to do any normal activities      5-6 on  scale    It kept me up all night 7. RECURRENT SYMPTOM: "Have you ever had this type of stomach pain before?" If Yes, ask: "When was the last time?" and "What happened that time?"      No history 8. CAUSE: "What do you think is causing the stomach pain?"     I don't know.    I have a IUD but that feels fine.    It's a very different feeling than I've ever had.    I had Covid last week of June.   No diarrhea or vomiting with Covid. 9. RELIEVING/AGGRAVATING FACTORS: "What makes it better or worse?" (e.g., movement, antacids, bowel movement)     Nothing helps 10. OTHER SYMPTOMS: "Do you have any other symptoms?" (e.g., back pain, diarrhea, fever, urination pain, vomiting)       No diarrhea vomiting or fever.  My urine is darker but no burning.   I had a UTI when I had Covid.  2 rounds of antibiotics and was in the hospital for other issues.    I had IV antibiotics in the hospital.   Not on antibiotics now. 11. PREGNANCY: "Is there any chance you are pregnant?" "When was your last menstrual  period?"       Should not be.   Have an IUD.  Protocols used: Abdominal Pain - Female-A-AH

## 2020-09-27 ENCOUNTER — Ambulatory Visit
Admission: RE | Admit: 2020-09-27 | Discharge: 2020-09-27 | Disposition: A | Discharge status: Home / Self Care | Payer: 59 | Source: Ambulatory Visit | Attending: Family Medicine | Admitting: Family Medicine

## 2020-09-27 ENCOUNTER — Encounter: Payer: Self-pay | Admitting: Family Medicine

## 2020-09-27 ENCOUNTER — Ambulatory Visit (INDEPENDENT_AMBULATORY_CARE_PROVIDER_SITE_OTHER): Payer: 59 | Admitting: Family Medicine

## 2020-09-27 ENCOUNTER — Other Ambulatory Visit: Payer: Self-pay

## 2020-09-27 ENCOUNTER — Telehealth: Payer: Self-pay

## 2020-09-27 ENCOUNTER — Ambulatory Visit
Admission: RE | Admit: 2020-09-27 | Discharge: 2020-09-27 | Disposition: A | Discharge status: Home / Self Care | Payer: 59 | Source: Home / Self Care | Attending: Family Medicine | Admitting: Family Medicine

## 2020-09-27 VITALS — BP 118/78 | HR 99 | Temp 98.2°F | Resp 16 | Ht 68.0 in | Wt 224.2 lb

## 2020-09-27 DIAGNOSIS — Z87440 Personal history of urinary (tract) infections: Secondary | ICD-10-CM

## 2020-09-27 DIAGNOSIS — R8290 Unspecified abnormal findings in urine: Secondary | ICD-10-CM

## 2020-09-27 DIAGNOSIS — F4322 Adjustment disorder with anxiety: Secondary | ICD-10-CM

## 2020-09-27 DIAGNOSIS — F419 Anxiety disorder, unspecified: Secondary | ICD-10-CM

## 2020-09-27 DIAGNOSIS — K219 Gastro-esophageal reflux disease without esophagitis: Secondary | ICD-10-CM | POA: Insufficient documentation

## 2020-09-27 DIAGNOSIS — R1010 Upper abdominal pain, unspecified: Secondary | ICD-10-CM | POA: Insufficient documentation

## 2020-09-27 DIAGNOSIS — R41840 Attention and concentration deficit: Secondary | ICD-10-CM

## 2020-09-27 DIAGNOSIS — F330 Major depressive disorder, recurrent, mild: Secondary | ICD-10-CM

## 2020-09-27 LAB — POCT URINALYSIS DIPSTICK
Bilirubin, UA: NEGATIVE
Blood, UA: POSITIVE
Glucose, UA: NEGATIVE
Ketones, UA: POSITIVE
Leukocytes, UA: NEGATIVE
Nitrite, UA: POSITIVE
Protein, UA: NEGATIVE
Spec Grav, UA: 1.02 (ref 1.010–1.025)
Urobilinogen, UA: 0.2 E.U./dL
pH, UA: 5 (ref 5.0–8.0)

## 2020-09-27 LAB — POCT URINE PREGNANCY: Preg Test, Ur: NEGATIVE

## 2020-09-27 MED ORDER — PANTOPRAZOLE SODIUM 40 MG PO TBEC: 40.0000 mg | DELAYED_RELEASE_TABLET | Freq: Two times a day (BID) | ORAL | 1 refills | Status: DC

## 2020-09-27 NOTE — Telephone Encounter (Signed)
Verne Grain called report on pt's ultrasound. Impression was hepatic steatosis. No acute finding from ultrasound. Per office note from PCP, she wrote an addendum that acknowledged these results.

## 2020-09-27 NOTE — Progress Notes (Signed)
Patient ID: Yvonne Little, female    DOB: 09-15-1995, 25 y.o.   MRN: [id number]  PCP: [name], PA-C  Chief Complaint  Patient presents with   Abdominal Pain    Midline with nausea    Subjective:   Yvonne Little is a 25 y.o. female, presents to clinic with CC of the following:  HPI  Abd pain the started July 5th, was in hospital until the 4th, went home and then had severe watery diarrhea, a few days later -patient was initially admitted for a nerve block procedure postop complication, she was also found to have a UTI and be COVID-positive In the hospital she was treated with IV antibiotics she thinks she got maybe 2 doses but she did not have any oral antibiotics she notes she was in the hospital for 5 days, chart shows cefepime, Vanco and ceftriaxone?  Abdominal pain began July 5 has been ongoing for about ~3 weeks, gradually worsening, located in her upper abdomen feels like a band across her upper abdomen but slightly worse to the epigastric area and left upper quadrant without radiation, worse after eating anything, associated bloating, severe, constant and dull upon waking, at most severe has been rated 6-7/10. For a few days after getting home she had profuse watery and frequent diarrhea, nausea was managed with medications and she did not have any vomiting Her stools have returned to normal over the past 1 to 2 weeks but her abdominal pain has gradually worsened. When pain is severe she tries to have a bowel movement but there is no improvement with having a BM She has taken Protonix a few times but was not told to take daily, so she only took Protonix while in the hospital and only the last 3 days Also no improvement with taking Protonix or using Pepcid over-the-counter, she has Zofran and Compazine but these do not improve her abdominal pain either She has difficulty describing the quality or characteristics of her pain No past abdominal surgeries With her recent  UTI she had no urinary symptoms and she currently denies any vaginal symptoms, urinary symptoms such as dysuria, hematuria, urinary frequency -possibly some change in urine order She denies ETOH use, no marijuana use No NSAIDs  She has been on Paxil for many years this has seemed to cause acid reflux recently and dose was just decreased from 40 mg to 20 mg, patient is not able to tell me who is currently prescribing or managing this medicine? She was put on Cymbalta for her chronic pain and CRPS -but this caused mood changes so was discontinued She is also on gabapentin for managing pain-she has not run out of any of these medications recently  Urine screened today for abd pain shows hematuria, nitrite positive, foul odor, dark in color and positive ketones Patient states that she always has blood in her urine and there is a strong family history of similar problems but she has never seen a urologist or nephrologist?  Results for orders placed or performed in visit on 09/27/20  POCT urinalysis dipstick  Result Value Ref Range   Color, UA dark yellow    Clarity, UA clear    Glucose, UA Negative Negative   Bilirubin, UA neg    Ketones, UA positive    Spec Grav, UA 1.020 1.010 - 1.025   Blood, UA positive    pH, UA 5.0 5.0 - 8.0   Protein, UA Negative Negative   Urobilinogen, UA 0.2 0.2 or 1.0  E.U./dL   Nitrite, UA positive    Leukocytes, UA Negative Negative   Appearance dark    Odor foul   POCT urine pregnancy  Result Value Ref Range   Preg Test, Ur Negative Negative   She is sexually active only with her husband no concern for exposure to STDs or past STD she denies any pelvic pain, dyspareunia, vaginal discharge, genital sores lesions rash  On her chart she is taking Klonopin -she states he was prescribed a neurology, she previously consulted with Dr. Malvin Johns to help manage her panic disorder anxiety and depression?  She did not feel that the visit went very well and she would like  to establish with a psychiatrist She reports suffering from anxiety most of her life for as long as she can remember but anxiety and panic disorders or panic attacks started when she was a teenager in high school Depression screen [facility] 2/9 09/27/2020 01/15/2019 12/25/2018  Decreased Interest 0 1 2  Down, Depressed, Hopeless 0 1 1  PHQ - 2 Score 0 2 3  Altered sleeping Tired, decreased energy Change in appetite 0 2 3  Feeling bad or failure about yourself  0 0 0  Trouble concentrating 0 0 3  Moving slowly or fidgety/restless 0 0 3  Suicidal thoughts 0 0 0  PHQ-9 Score Difficult doing work/chores Not difficult at all Somewhat difficult Extremely dIfficult     Patient Active Problem List   Diagnosis Date Noted   UTI (urinary tract infection) 08/25/2020   Intractable back pain 08/25/2020   COVID 08/24/2020   Mild episode of recurrent major depressive disorder (HCC) 12/01/2018   Class 1 obesity with body mass index (BMI) of 31.0 to 31.9 in adult 12/01/2018   Deficiency of other vitamins 12/31/2016   Anxiety disorder, unspecified 12/06/2016   Subclinical hypothyroidism 09/12/2016   Attention and concentration deficit 08/24/2015   Adjustment disorder with anxiety 07/27/2015      Current Outpatient Medications:    ARMOUR THYROID 90 MG tablet, Take 90 mg by mouth daily., Disp: , Rfl:    clonazePAM (KLONOPIN) 0.5 MG tablet, Take 0.25 mg by mouth daily as needed for anxiety., Disp: , Rfl:    cyanocobalamin (,VITAMIN B-12,) 1000 MCG/ML injection, Inject 1,000 mcg into the muscle every 30 (thirty) days., Disp: , Rfl:    gabapentin (NEURONTIN) 300 MG capsule, Take 1 capsule (300 mg total) by mouth 3 (three) times daily., Disp: 90 capsule, Rfl: 0   ondansetron (ZOFRAN-ODT) 4 MG disintegrating tablet, Take 1 tablet (4 mg total) by mouth every 8 (eight) hours as needed for nausea or vomiting., Disp: 8 tablet, Rfl: 0   pantoprazole (PROTONIX) 40 MG tablet, Take 1 tablet (40  mg total) by mouth daily at 12 noon., Disp: 30 tablet, Rfl: 0   pantoprazole (PROTONIX) 40 MG tablet, Take 1 tablet (40 mg total) by mouth 2 (two) times daily., Disp: 120 tablet, Rfl: 1   PARoxetine (PAXIL) 40 MG tablet, Take 20 mg by mouth every morning. Anxiety and panic attacks, Disp: , Rfl:    prochlorperazine (COMPAZINE) 10 MG tablet, Take 1 tablet (10 mg total) by mouth 2 (two) times daily as needed for nausea or vomiting., Disp: 8 tablet, Rfl: 0   Allergies  Allergen Reactions   Escitalopram Other (See Comments)    Altered mental status.    Cymbalta [Duloxetine Hcl] Other (See Comments)    Severe mood changes  Levothyroxine Nausea Only   Lidocaine Hives and Rash     Social History   Tobacco Use   Smoking status: Never   Smokeless tobacco: Never  Vaping Use   Vaping Use: Every day   Substances: Nicotine  Substance Use Topics   Alcohol use: Not Currently   Drug use: Never      Chart Review Today: I personally reviewed active problem list, medication list, allergies, family history, social history, health maintenance, notes from last encounter, lab results, imaging with the patient/caregiver today.   Review of Systems  Constitutional:  Positive for unexpected weight change. Negative for activity change, appetite change, chills, diaphoresis, fatigue and fever.  HENT: Negative.    Eyes: Negative.   Respiratory: Negative.  Negative for cough, chest tightness, shortness of breath and wheezing.   Cardiovascular: Negative.  Negative for chest pain, palpitations and leg swelling.  Gastrointestinal:  Positive for abdominal pain and nausea. Negative for abdominal distention, blood in stool, constipation, diarrhea, rectal pain and vomiting.  Endocrine: Negative.   Genitourinary: Negative.   Musculoskeletal: Negative.   Skin: Negative.  Negative for color change.  Allergic/Immunologic: Negative.   Neurological: Negative.   Hematological: Negative.   Psychiatric/Behavioral:  Negative.    All other systems reviewed and are negative.     Objective:   Vitals:   09/27/20 1405  BP: 118/78  Pulse: 99  Resp: 16  Temp: 98.2 F (36.8 C)  SpO2: 99%  Weight: 224 lb 3.2 oz (101.7 kg)  Height: 5\' 8"  (1.727 m)    Body mass index is 34.09 kg/m.  Physical Exam Vitals and nursing note reviewed.  Constitutional:      General: She is not in acute distress.    Appearance: Normal appearance. She is well-developed. She is obese. She is not ill-appearing, toxic-appearing or diaphoretic.     Interventions: Face mask in place.  HENT:     Head: Normocephalic and atraumatic.     Right Ear: External ear normal.     Left Ear: External ear normal.  Eyes:     General: Lids are normal. No scleral icterus.       Right eye: No discharge.        Left eye: No discharge.     Conjunctiva/sclera: Conjunctivae normal.  Neck:     Trachea: Phonation normal. No tracheal deviation.  Cardiovascular:     Rate and Rhythm: Normal rate and regular rhythm.     Pulses: Normal pulses.          Radial pulses are 2+ on the right side and 2+ on the left side.       Posterior tibial pulses are 2+ on the right side and 2+ on the left side.     Heart sounds: Normal heart sounds. No murmur heard.   No friction rub. No gallop.  Pulmonary:     Effort: Pulmonary effort is normal. No respiratory distress.     Breath sounds: Normal breath sounds. No stridor. No wheezing, rhonchi or rales.  Chest:     Chest wall: No tenderness.  Abdominal:     General: Bowel sounds are normal. There is no distension.     Palpations: Abdomen is soft. There is no hepatomegaly, mass or pulsatile mass.     Tenderness: There is abdominal tenderness in the epigastric area and left upper quadrant. There is no right CVA tenderness, left CVA tenderness, guarding or rebound. Negative signs include Murphy's sign and McBurney's sign.     Hernia:  No hernia is present.     Comments: Obese soft abd, non-distended, normal BSx4   Musculoskeletal:     Right lower leg: No edema.     Left lower leg: No edema.  Skin:    General: Skin is warm and dry.     Capillary Refill: Capillary refill takes less than 2 seconds.     Coloration: Skin is not jaundiced or pale.     Findings: No lesion or rash.  Neurological:     Mental Status: She is alert.     Motor: No abnormal muscle tone.     Gait: Gait normal.  Psychiatric:        Mood and Affect: Mood normal.        Speech: Speech normal.        Behavior: Behavior normal.     Results for orders placed or performed in visit on 09/27/20  POCT urinalysis dipstick  Result Value Ref Range   Color, UA dark yellow    Clarity, UA clear    Glucose, UA Negative Negative   Bilirubin, UA neg    Ketones, UA positive    Spec Grav, UA 1.020 1.010 - 1.025   Blood, UA positive    pH, UA 5.0 5.0 - 8.0   Protein, UA Negative Negative   Urobilinogen, UA 0.2 0.2 or 1.0 E.U./dL   Nitrite, UA positive    Leukocytes, UA Negative Negative   Appearance dark    Odor foul   POCT urine pregnancy  Result Value Ref Range   Preg Test, Ur Negative Negative       Assessment & Plan:     ICD-10-CM   1. Pain of upper abdomen  R10.10 POCT urinalysis dipstick    POCT urine pregnancy    Urine Culture    CBC with Differential/Platelet    COMPLETE METABOLIC PANEL WITH GFR    Lipase    US ABDOMEN LIMITED RUQ (LIVER/GB)    pantoprazole (PROTONIX) 40 MG tablet    DG Abd 1 View    2. History of recurrent UTI (urinary tract infection)  Z87.440 POCT urinalysis dipstick    Urine Culture    DG Abd 1 View    3. Gastroesophageal reflux disease, unspecified whether esophagitis present  K21.9 pantoprazole (PROTONIX) 40 MG tablet    DG Abd 1 View    4. Foul smelling urine  R82.90 POCT urinalysis dipstick    Urine Culture    5. Anxiety disorder, unspecified type  F41.9 Ambulatory referral to Psychiatry    PARoxetine (PAXIL) 40 MG tablet   on paxil for years 40 mg dose, but causing GERD so  decreased to 20 mg     6. Adjustment disorder with anxiety  F43.22 Ambulatory referral to Psychiatry    PARoxetine (PAXIL) 40 MG tablet    7. Attention and concentration deficit  R41.840 Ambulatory referral to Psychiatry    8. Mild episode of recurrent major depressive disorder (HCC) Chronic F33.0 PARoxetine (PAXIL) 40 MG tablet   On Paxil, patient is not sure who is prescribing or managing she would like to establish with psychiatry, PHQ-9 reviewed today     Ua consistent today with UTI - however pt is asx and we have discussed and agreed to wait for urine culture prior to adding abx  RUQ [place] today to r/o any gallbladder pathology - neg murphy's, but pain increases eating - r/o cholelithiasis and cholecystitis May be gastritis or gastric ulcer?  Recheck her CBC to rule  out any drop in blood counts She has not been taking Protonix consistently have reviewed that and will do twice daily dosing and have discussed proper administration with her thyroid meds She can continue her antiemetics and Pepcid as needed She has an appetite but with increased pain she is not eating very much continue bland diet and pushing fluids and gradually increase as tolerated KUB ordered to assess gas and bowel pattern -she is having normal stools currently she endorses bloating but she does not appear distended  If no improvement may need GI consult         Danelle BerryLeisa Aalyiah Camberos, PA-C 09/27/20 2:27 PM    5:10 PM RT reached out with completed RUQ US - Reviewed results and radiology report and KUB no Cholecystitis or Cholelithiasis - Hepatic steatosis - pt left imaging - will have staff call her tomorrow with results when calling for labs  KUB images looked at today - no radiology reading yet   Visit today - Total time of this visit was 60 min + With patient in exam room reviewing recent hospitalization, labs, images, getting history and doing physical exam - more than 35 min Chart review - more than 10 min Chart  documentation-  documentation in EMR, and coordinating care and treatment plan today with stat imaging orders, reviewing results today more than 20 min

## 2020-09-27 NOTE — Patient Instructions (Signed)
[facility] - mental health/behavioral health [facility] - look up in [place] - they have walk in hours

## 2020-09-28 LAB — CBC WITH DIFFERENTIAL/PLATELET
Absolute Monocytes: 561 cells/uL (ref 200–950)
Basophils Absolute: 50 cells/uL (ref 0–200)
Basophils Relative: 0.7 %
Eosinophils Absolute: 241 cells/uL (ref 15–500)
Eosinophils Relative: 3.4 %
HCT: 40.5 % (ref 35.0–45.0)
Hemoglobin: 13.5 g/dL (ref 11.7–15.5)
Lymphs Abs: 2421 cells/uL (ref 850–3900)
MCH: 30.5 pg (ref 27.0–33.0)
MCHC: 33.3 g/dL (ref 32.0–36.0)
MCV: 91.6 fL (ref 80.0–100.0)
MPV: 9.7 fL (ref 7.5–12.5)
Monocytes Relative: 7.9 %
Neutro Abs: 3827 cells/uL (ref 1500–7800)
Neutrophils Relative %: 53.9 %
Platelets: 231 10*3/uL (ref 140–400)
RBC: 4.42 10*6/uL (ref 3.80–5.10)
RDW: 13.3 % (ref 11.0–15.0)
Total Lymphocyte: 34.1 %
WBC: 7.1 10*3/uL (ref 3.8–10.8)

## 2020-09-28 LAB — COMPLETE METABOLIC PANEL WITH GFR
AG Ratio: 2 (calc) (ref 1.0–2.5)
ALT: 74 U/L — ABNORMAL HIGH (ref 6–29)
AST: 36 U/L — ABNORMAL HIGH (ref 10–30)
Albumin: 4.6 g/dL (ref 3.6–5.1)
Alkaline phosphatase (APISO): 89 U/L (ref 31–125)
BUN: 11 mg/dL (ref 7–25)
CO2: 27 mmol/L (ref 20–32)
Calcium: 9.7 mg/dL (ref 8.6–10.2)
Chloride: 105 mmol/L (ref 98–110)
Creat: 0.8 mg/dL (ref 0.50–0.96)
Globulin: 2.3 g/dL (calc) (ref 1.9–3.7)
Glucose, Bld: 100 mg/dL — ABNORMAL HIGH (ref 65–99)
Potassium: 5 mmol/L (ref 3.5–5.3)
Sodium: 141 mmol/L (ref 135–146)
Total Bilirubin: 0.5 mg/dL (ref 0.2–1.2)
Total Protein: 6.9 g/dL (ref 6.1–8.1)
eGFR: 104 mL/min/{1.73_m2} (ref >=60)

## 2020-09-28 LAB — LIPASE: Lipase: 18 U/L (ref 7–60)

## 2020-09-29 ENCOUNTER — Telehealth: Payer: Self-pay | Admitting: Family Medicine

## 2020-09-29 ENCOUNTER — Other Ambulatory Visit: Payer: Self-pay | Admitting: Family Medicine

## 2020-09-29 DIAGNOSIS — N390 Urinary tract infection, site not specified: Secondary | ICD-10-CM

## 2020-09-29 DIAGNOSIS — R319 Hematuria, unspecified: Secondary | ICD-10-CM

## 2020-09-29 LAB — URINE CULTURE
MICRO NUMBER:: 12192354
SPECIMEN QUALITY:: ADEQUATE

## 2020-09-29 MED ORDER — CEPHALEXIN 500 MG PO CAPS: 500.0000 mg | ORAL_CAPSULE | Freq: Two times a day (BID) | ORAL | 0 refills | Status: AC

## 2020-09-29 NOTE — Telephone Encounter (Signed)
Pt saw that her labs where back for positve UTI wanting to know if any medications where sent. Advised that cephALEXin (KEFLEX) 500 MG capsule [[id number]] was sent to her pharmacy forr her.

## 2020-09-29 NOTE — Telephone Encounter (Signed)
Left detailed vm of urine culture results

## 2020-10-05 ENCOUNTER — Ambulatory Visit: Payer: Self-pay | Admitting: *Deleted

## 2020-10-05 ENCOUNTER — Telehealth: Payer: Self-pay

## 2020-10-05 NOTE — Telephone Encounter (Signed)
Copied from CRM [id number]. Topic: General - Other >> Oct 04, 2020  4:47 PM Pawlus, Yvonne Little wrote: Reason for CRM: Pt called in stating the medication prescribed for her UTI is not working, pt wanted to know if Little different medication could be sent in, please advise.

## 2020-10-05 NOTE — Telephone Encounter (Signed)
With with UTI, started on Keflex, has one day left in course. States symptoms worsening and now with lower back pain. Denies fever., "I don't feel feverish." Reports dysuria, frequency, urgency. States "Only antibiotic that ever helps me is Cipro." States "I don't want to end in hospital again."Assured pt NT would route to practice for PCPs review. Advised ED/UC if symptoms worsen. Pt verbalizes understanding.  Please advise: CB# [phone]

## 2020-10-05 NOTE — Telephone Encounter (Signed)
Pt scheduled tomorrow at 1:20 

## 2020-10-05 NOTE — Telephone Encounter (Signed)
Reason for Disposition  [1] Taking antibiotic > 24 hours for UTI AND [2] flank or lower back pain worsening  Answer Assessment - Initial Assessment Questions 1. ANTIBIOTIC: "What antibiotic are you taking?" "How many times per day?"     Keflex 2. DURATION: "When was the antibiotic started?"     1 day left 3. MAIN SYMPTOM: "What is the main symptom you are concerned about?"     Worsening symptoms, now with lower back pain, dysuria, frequency. 4. FEVER: "Do you have a fever?" If Yes, ask: "What is it, how was it measured, and when did it start?"     no 5. OTHER SYMPTOMS: "Do you have any other symptoms?" (e.g., flank pain, vaginal discharge, blood in urine)     Lower back pain  Protocols used: Urinary Tract Infection on Antibiotic Follow-up Call - [facility]

## 2020-10-06 ENCOUNTER — Ambulatory Visit: Payer: 59 | Admitting: Family Medicine

## 2020-10-11 ENCOUNTER — Encounter (HOSPITAL_BASED_OUTPATIENT_CLINIC_OR_DEPARTMENT_OTHER): Payer: Self-pay

## 2020-10-11 ENCOUNTER — Emergency Department (HOSPITAL_BASED_OUTPATIENT_CLINIC_OR_DEPARTMENT_OTHER): Payer: 59

## 2020-10-11 ENCOUNTER — Emergency Department (HOSPITAL_BASED_OUTPATIENT_CLINIC_OR_DEPARTMENT_OTHER)
Admission: EM | Admit: 2020-10-11 | Discharge: 2020-10-11 | Disposition: A | Discharge status: Home / Self Care | Payer: 59 | Attending: Emergency Medicine | Admitting: Emergency Medicine

## 2020-10-11 ENCOUNTER — Other Ambulatory Visit: Payer: Self-pay

## 2020-10-11 DIAGNOSIS — R300 Dysuria: Secondary | ICD-10-CM | POA: Insufficient documentation

## 2020-10-11 DIAGNOSIS — R109 Unspecified abdominal pain: Secondary | ICD-10-CM | POA: Diagnosis present

## 2020-10-11 LAB — URINALYSIS, ROUTINE W REFLEX MICROSCOPIC
Bilirubin Urine: NEGATIVE
Glucose, UA: NEGATIVE mg/dL
Ketones, ur: NEGATIVE mg/dL
Leukocytes,Ua: NEGATIVE
Nitrite: NEGATIVE
Protein, ur: 30 mg/dL — AB
Specific Gravity, Urine: 1.013 (ref 1.005–1.030)
pH: 5.5 (ref 5.0–8.0)

## 2020-10-11 LAB — COMPREHENSIVE METABOLIC PANEL
ALT: 75 U/L — ABNORMAL HIGH (ref 0–44)
AST: 45 U/L — ABNORMAL HIGH (ref 15–41)
Albumin: 4.3 g/dL (ref 3.5–5.0)
Alkaline Phosphatase: 75 U/L (ref 38–126)
Anion gap: 9 (ref 5–15)
BUN: 15 mg/dL (ref 6–20)
CO2: 26 mmol/L (ref 22–32)
Calcium: 9.6 mg/dL (ref 8.9–10.3)
Chloride: 105 mmol/L (ref 98–111)
Creatinine, Ser: 1.21 mg/dL — ABNORMAL HIGH (ref 0.44–1.00)
GFR, Estimated: >60 mL/min (ref >60)
Glucose, Bld: 107 mg/dL — ABNORMAL HIGH (ref 70–99)
Potassium: 3.8 mmol/L (ref 3.5–5.1)
Sodium: 140 mmol/L (ref 135–145)
Total Bilirubin: 0.7 mg/dL (ref 0.3–1.2)
Total Protein: 6.9 g/dL (ref 6.5–8.1)

## 2020-10-11 LAB — CBC
HCT: 39.7 % (ref 36.0–46.0)
Hemoglobin: 13.3 g/dL (ref 12.0–15.0)
MCH: 30.5 pg (ref 26.0–34.0)
MCHC: 33.5 g/dL (ref 30.0–36.0)
MCV: 91.1 fL (ref 80.0–100.0)
Platelets: 274 10*3/uL (ref 150–400)
RBC: 4.36 MIL/uL (ref 3.87–5.11)
RDW: 13.7 % (ref 11.5–15.5)
WBC: 12.5 10*3/uL — ABNORMAL HIGH (ref 4.0–10.5)
nRBC: 0 % (ref 0.0–0.2)

## 2020-10-11 LAB — PREGNANCY, URINE: Preg Test, Ur: NEGATIVE

## 2020-10-11 MED ORDER — ONDANSETRON HCL 4 MG/2ML IJ SOLN: 4.0000 mg | Freq: Once | INTRAMUSCULAR | Status: AC | PRN

## 2020-10-11 MED ORDER — ACETAMINOPHEN 500 MG PO TABS
1000.0000 mg | ORAL_TABLET | Freq: Once | ORAL | Status: AC
  Administered 2020-10-11: 1000 mg via ORAL
  Filled 2020-10-11: qty 2

## 2020-10-11 MED ORDER — SODIUM CHLORIDE 0.9 % IV BOLUS
1000.0000 mL | Freq: Once | INTRAVENOUS | Status: AC
  Administered 2020-10-11: 1000 mL via INTRAVENOUS

## 2020-10-11 MED ORDER — ONDANSETRON HCL 4 MG/2ML IJ SOLN
INTRAMUSCULAR | Status: AC
  Administered 2020-10-11: 4 mg via INTRAVENOUS
  Filled 2020-10-11: qty 2

## 2020-10-11 MED ORDER — PHENAZOPYRIDINE HCL 200 MG PO TABS: 200.0000 mg | ORAL_TABLET | Freq: Three times a day (TID) | ORAL | 0 refills | Status: DC | PRN

## 2020-10-11 MED ORDER — IOHEXOL 350 MG/ML SOLN
75.0000 mL | Freq: Once | INTRAVENOUS | Status: AC | PRN
  Administered 2020-10-11: 75 mL via INTRAVENOUS

## 2020-10-11 MED ORDER — ONDANSETRON HCL 4 MG/2ML IJ SOLN
4.0000 mg | Freq: Once | INTRAMUSCULAR | Status: AC
  Administered 2020-10-11: 4 mg via INTRAVENOUS
  Filled 2020-10-11: qty 2

## 2020-10-11 NOTE — ED Triage Notes (Signed)
Patient has been on rocephin keflex cipro and still having uti symptoms.

## 2020-10-11 NOTE — Discharge Instructions (Addendum)
You were evaluated in the Emergency Department and after careful evaluation, we did not find any emergent condition requiring admission or further testing in the hospital.  Your exam/testing today is overall reassuring.  Recommend continued hydration at home, use of the Pyridium as needed for discomfort, and follow-up with the urologist for further management.  Please return to the Emergency Department if you experience any worsening of your condition.   Thank you for allowing [place] to be a part of your care.

## 2020-10-11 NOTE — ED Provider Notes (Signed)
[facility] EMERGENCY [facility] Emergency Department Provider Note MRN:  [id number]  Arrival date & time: 10/11/20     Chief Complaint   Recurrent UTI   History of Present Illness   Yvonne Little is a 25 y.o. year-old female with a history of factor V Leiden mutation presenting to the ED with chief complaint of flank pain.  Bilateral flank pain worse on the left, associated with continued dysuria.  Has been diagnosed with a few UTIs recently, currently on ciprofloxacin but still having pain and burning.  Denies any fever, no chest pain or shortness of breath, no abdominal pain.  Symptoms are moderate to severe, constant, no exacerbating or alleviating factors.  Review of Systems  A complete 10 system review of systems was obtained and all systems are negative except as noted in the HPI and PMH.   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 No pertinent surgical history.  Family History  Problem Relation Age of Onset   Hyperlipidemia Mother    Hypertension Maternal Grandmother    Hyperlipidemia Maternal Grandmother    Hypertension Maternal Grandfather    Hyperlipidemia Maternal Grandfather    Stroke Maternal Grandfather    Diabetes Paternal Grandmother    Non-Hodgkin's lymphoma Paternal Grandmother    Melanoma Paternal Grandfather     Social History   Socioeconomic History   Marital status: Significant Other    Spouse name: Not on file   Number of children: Not on file   Years of education: 12   Highest education level: Bachelor's degree (e.g., BA, AB, BS)  Occupational History   Not on file  Tobacco Use   Smoking status: Never   Smokeless tobacco: Never  Vaping Use   Vaping Use: Every day   Substances: Nicotine  Substance  and Sexual Activity   Alcohol use: Not Currently   Drug use: Never   Sexual activity: Yes  Other Topics Concern   Not on file  Social History Narrative   Not on file   Social Determinants of Health   Financial Resource Strain: Not on file  Food Insecurity: Not on file  Transportation Needs: Not on file  Physical Activity: Not on file  Stress: Not on file  Social Connections: Not on file  Intimate Partner Violence: Not on file     Physical Exam   Vitals:   10/11/20 0257 10/11/20 0315  BP: 119/69 111/85  Pulse: 83 92  Resp: 18 16  Temp:    SpO2: 100% 100%    CONSTITUTIONAL: Well-appearing, NAD NEURO:  Alert and oriented x 3, no focal deficits EYES:  eyes equal and reactive ENT/NECK:  no LAD, no JVD CARDIO: Regular rate, well-perfused, normal S1 and S2 PULM:  CTAB no wheezing or rhonchi GI/GU:  normal bowel sounds, non-distended, bilateral CVA tenderness MSK/SPINE:  No gross deformities, no edema SKIN:  no rash, atraumatic PSYCH:  Appropriate speech and behavior  *Additional and/or pertinent findings included in MDM below  Diagnostic and Interventional Summary    EKG Interpretation  Date/Time:    Ventricular Rate:    PR Interval:    QRS Duration:   QT Interval:    QTC Calculation:   R Axis:     Text Interpretation:  Labs Reviewed  COMPREHENSIVE METABOLIC PANEL - Abnormal; Notable for the following components:      Result Value   Glucose, Bld 107 (*)    Creatinine, Ser 1.21 (*)    AST 45 (*)    ALT 75 (*)    All other components within normal limits  CBC - Abnormal; Notable for the following components:   WBC 12.5 (*)    All other components within normal limits  URINALYSIS, ROUTINE W REFLEX MICROSCOPIC - Abnormal; Notable for the following components:   APPearance HAZY (*)    Hgb urine dipstick SMALL (*)    Protein, ur 30 (*)    All other components within normal limits  URINE CULTURE  PREGNANCY, URINE    CT ABDOMEN PELVIS W CONTRAST   Final Result      Medications  ondansetron (ZOFRAN) injection 4 mg (4 mg Intravenous Given 10/11/20 0053)  ondansetron (ZOFRAN) injection 4 mg (4 mg Intravenous Given 10/11/20 0321)  acetaminophen (TYLENOL) tablet 1,000 mg (1,000 mg Oral Given 10/11/20 0320)  sodium chloride 0.9 % bolus 1,000 mL (1,000 mLs Intravenous New Bag/Given 10/11/20 0324)  iohexol (OMNIPAQUE) 350 MG/ML injection 75 mL (75 mLs Intravenous Contrast Given 10/11/20 0336)     Procedures  /  Critical Care Procedures  ED Course and Medical Decision Making  I have reviewed the triage vital signs, the nursing notes, and pertinent available records from the EMR.  Listed above are laboratory and imaging tests that I personally ordered, reviewed, and interpreted and then considered in my medical decision making (see below for details).  CT to evaluate for concomitant stone versus renal abscess which would explain recurrent UTI.     Work-up is overall very reassuring.  Normal labs, urinalysis without evidence of infection, CT scan is not showing any signs of pyelonephritis or abscess or any acute process.  Unclear etiology of patient's flank pain, could be MSK.  Starting to have more of a chronic dysuria, considering interstitial cystitis.  Will refer to urology given the recurrent UTIs recently, advised to continue the course of ciprofloxacin.  Will provide Pyridium to see if this helps with her symptoms.  The flank pain is low and does not involve the chest or rib area, nothing to suggest PE.  Appropriate for discharge.  Elmer Sow. Pilar Plate, MD [facility] Emergency Medicine [facility] [email or web address]  Final Clinical Impressions(s) / ED Diagnoses     ICD-10-CM   1. Flank pain  R10.9       ED Discharge Orders          Ordered    phenazopyridine (PYRIDIUM) 200 MG tablet  3 times daily PRN        10/11/20 0409             Discharge Instructions Discussed with and Provided to Patient:      Discharge Instructions      You were evaluated in the Emergency Department and after careful evaluation, we did not find any emergent condition requiring admission or further testing in the hospital.  Your exam/testing today is overall reassuring.  Recommend continued hydration at home, use of the Pyridium as needed for discomfort, and follow-up with the urologist for further management.  Please return to the Emergency Department if you experience any worsening of your condition.   Thank you for allowing [place] to be a part of your care.        Sabas Sous, MD 10/11/20 [phone]

## 2020-10-12 ENCOUNTER — Ambulatory Visit (INDEPENDENT_AMBULATORY_CARE_PROVIDER_SITE_OTHER): Payer: 59 | Admitting: Family Medicine

## 2020-10-12 ENCOUNTER — Encounter: Payer: Self-pay | Admitting: Family Medicine

## 2020-10-12 VITALS — BP 126/82 | HR 97 | Temp 98.1°F | Resp 16 | Ht 68.0 in | Wt 224.2 lb

## 2020-10-12 DIAGNOSIS — N390 Urinary tract infection, site not specified: Secondary | ICD-10-CM | POA: Diagnosis not present

## 2020-10-12 MED ORDER — PROMETHAZINE HCL 12.5 MG PO TABS: 12.5000 mg | ORAL_TABLET | Freq: Three times a day (TID) | ORAL | 0 refills | Status: DC | PRN

## 2020-10-12 NOTE — Progress Notes (Signed)
    SUBJECTIVE:   CHIEF COMPLAINT / HPI:   URINARY SYMPTOMS - seen in ED last night with urine culture with 10k staph haemolyticus. Susceptibilities pending.  - 2 culture positive UTIs in 2 months  - suprapubic pressure, pain with urination, back pain.   - got a little better with keflex starting 8/2 but still with sx. - went to [facility] 8/11 with UA mod blood, otherwise negative. Sent for culture, unable to see results. Rx cipro. - still on cipro, has 3 days left.  Dysuria: yes Urinary frequency: no Urgency: no Urinary incontinence: no Foul odor: no Hematuria: no Abdominal pain: no Back pain: yes Suprapubic pain/pressure: yes Flank pain: yes Fever:  no Vomiting: yes Relief with cranberry juice:  hasn't tried. Relief with pyridium: yes Status: better/worse/stable Previous urinary tract infection: yes Recurrent urinary tract infection:  2 in the last 2 months Sexual activity: monogomous History of sexually transmitted disease: no Vaginal discharge: no Treatments attempted: antibiotics, pyridium, and increasing fluids    OBJECTIVE:   BP 126/82   Pulse 97   Temp 98.1 F (36.7 C)   Resp 16   Ht 5\' 8"  (1.727 m)   Wt 224 lb 3.2 oz (101.7 kg)   SpO2 97%   BMI 34.09 kg/m   Gen: well appearing, in NAD Card: Reg rate Lungs: Comfortable WOB Ext: WWP, no edema  ASSESSMENT/PLAN:   Urinary problem Culture early this am from ED with 10k staph haemolyticus, susceptibilites pending. Given less than 100k colonies and no leuks or nitrites on UA, may also have element of irritative bladder. Recommend avoiding bladder irritants such as caffeine, alcohol, artificial sweeteners. Continue antibiotics. Referred to urology for further workup given recurrent culture positive UTI.   , DO

## 2020-10-12 NOTE — Patient Instructions (Addendum)
It was great to see you!  Our plans for today:  - Keep taking your antibiotic.  - Take the phenerghan as needed for nausea. - We will await the rest of your culture result - Avoid foods that irritate the bladder - caffeine, sugary beverages, alcohol, chocolate, teas. - We are referring you to Urology. If you don't hear about an appointment by early next week, give [place] a call.   Take care and seek immediate care sooner if you develop any concerns.   Dr. Linwood Dibbles

## 2020-10-13 LAB — URINE CULTURE: Culture: 10000 — AB

## 2020-11-14 ENCOUNTER — Ambulatory Visit: Payer: 59 | Admitting: Urology

## 2020-11-14 DIAGNOSIS — N390 Urinary tract infection, site not specified: Secondary | ICD-10-CM

## 2020-11-14 NOTE — Progress Notes (Deleted)
Assessment: 1. Urinary tract infection without hematuria, site unspecified      Plan: ***  Chief Complaint: No chief complaint on file.   History of Present Illness:  Yvonne Little is a 25 y.o. year old female who is seen in consultation from [name], PA-C  for evaluation of UTI symptoms.   Past Medical History:  Past Medical History:  Diagnosis Date   Anxiety    Articular disc disorder of temporomandibular joint, 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   Depression    Factor 5 Leiden mutation, heterozygous (HCC)    Thyroid disease    hx of hypo    Past Surgical History:  No past surgical history on file.  Allergies:  Allergies  Allergen Reactions   Escitalopram Other (See Comments)    Altered mental status.    Cymbalta [Duloxetine Hcl] Other (See Comments)    Severe mood changes   Levothyroxine Nausea Only   Lidocaine Hives and Rash    Family History:  Family History  Problem Relation Age of Onset   Hyperlipidemia Mother    Hypertension Maternal Grandmother    Hyperlipidemia Maternal Grandmother    Hypertension Maternal Grandfather    Hyperlipidemia Maternal Grandfather    Stroke Maternal Grandfather    Diabetes Paternal Grandmother    Non-Hodgkin's lymphoma Paternal Grandmother    Melanoma Paternal Grandfather     Social History:  Social History   Tobacco Use   Smoking status: Never   Smokeless tobacco: Never  Vaping Use   Vaping Use: Every day   Substances: Nicotine  Substance Use Topics   Alcohol use: Not Currently   Drug use: Never    Review of symptoms:  Constitutional:  Negative for unexplained weight loss, night sweats, fever, chills ENT:  Negative for nose bleeds, sinus pain, painful swallowing CV:  Negative for chest pain, shortness of breath, exercise intolerance, palpitations, loss of consciousness Resp:  Negative for cough, wheezing, shortness of breath GI:  Negative for nausea, vomiting, diarrhea, bloody stools GU:  Positives  noted in HPI; otherwise negative for gross hematuria, dysuria, urinary incontinence Neuro:  Negative for seizures, poor balance, limb weakness, slurred speech Psych:  Negative for lack of energy, depression, anxiety Endocrine:  Negative for polydipsia, polyuria, symptoms of hypoglycemia (dizziness, hunger, sweating) Hematologic:  Negative for anemia, purpura, petechia, prolonged or excessive bleeding, use of anticoagulants  Allergic:  Negative for difficulty breathing or choking as a result of exposure to anything; no shellfish allergy; no allergic response (rash/itch) to materials, foods  Physical exam There were no vitals taken for this visit. GENERAL APPEARANCE:  Well appearing, well developed, well nourished, NAD HEENT: Atraumatic, Normocephalic, oropharynx clear. NECK: Supple without lymphadenopathy or thyromegaly. LUNGS: Clear to auscultation bilaterally. HEART: Regular Rate and Rhythm without murmurs, gallops, or rubs. ABDOMEN: Soft, non-tender, No Masses. EXTREMITIES: Moves all extremities well.  Without clubbing, cyanosis, or edema. NEUROLOGIC:  Alert and oriented x 3, normal gait, CN II-XII grossly intact.  MENTAL STATUS:  Appropriate. BACK:  Non-tender to palpation.  No CVAT SKIN:  Warm, dry and intact.    Results: No results found for this or any previous visit (from the past 24 hour(s)).

## 2021-03-31 ENCOUNTER — Encounter: Payer: Self-pay | Admitting: Internal Medicine

## 2021-03-31 ENCOUNTER — Ambulatory Visit (INDEPENDENT_AMBULATORY_CARE_PROVIDER_SITE_OTHER): Payer: 59 | Admitting: Internal Medicine

## 2021-03-31 VITALS — BP 104/62 | HR 98 | Temp 98.0°F | Resp 16 | Ht 68.0 in | Wt 208.5 lb

## 2021-03-31 DIAGNOSIS — R2232 Localized swelling, mass and lump, left upper limb: Secondary | ICD-10-CM | POA: Diagnosis not present

## 2021-03-31 DIAGNOSIS — M7989 Other specified soft tissue disorders: Secondary | ICD-10-CM

## 2021-03-31 NOTE — Progress Notes (Signed)
Acute Office Visit  Subjective:    Patient ID: Yvonne Little, female    DOB: January 31, 1996, 26 y.o.   MRN: [id number]  Chief Complaint  Patient presents with   Mass    Left armpit denies pain noticed it 03/29/21.    HPI Patient is in today for left axillary mass.  LUMP Duration: 2 days Location: left axilla  Onset: sudden Painful: no Discomfort: no Status:  not changing Trauma: no Redness: no Bruising: no Recent infection: unknown, nasal congestion a few days ago Swollen lymph nodes: no History of cancer: no personal history  Family history of cancer: yes grandmother has history of lymphoma  History of the same:  history of small pimples/abscesses but those were tender and would drain Associated signs and symptoms:none Denies fevers, weight changes, night sweats, no other skin changes.    Past Medical History:  Diagnosis Date   Anxiety    Articular disc disorder of temporomandibular joint, 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   Depression    Factor 5 Leiden mutation, heterozygous ([place])    Thyroid disease    hx of hypo    No past surgical history on file.  Family History  Problem Relation Age of Onset   Hyperlipidemia Mother    Hypertension Maternal Grandmother    Hyperlipidemia Maternal Grandmother    Hypertension Maternal Grandfather    Hyperlipidemia Maternal Grandfather    Stroke Maternal Grandfather    Diabetes Paternal Grandmother    Non-Hodgkin's lymphoma Paternal Grandmother    Melanoma Paternal Grandfather     Social History   Socioeconomic History   Marital status: Significant Other    Spouse name: Not on file   Number of children: Not on file   Years of education: 12   Highest education level: Bachelor's degree (e.g., BA, AB, BS)  Occupational History   Not on file  Tobacco Use   Smoking status: Never   Smokeless tobacco: Never  Vaping Use   Vaping Use: Every day   Substances: Nicotine  Substance and Sexual Activity   Alcohol use: Not  Currently   Drug use: Never   Sexual activity: Yes  Other Topics Concern   Not on file  Social History Narrative   Not on file   Social Determinants of Health   Financial Resource Strain: Not on file  Food Insecurity: Not on file  Transportation Needs: Not on file  Physical Activity: Not on file  Stress: Not on file  Social Connections: Not on file  Intimate Partner Violence: Not on file    Outpatient Medications Prior to Visit  Medication Sig Dispense Refill   ARMOUR THYROID 90 MG tablet Take 90 mg by mouth daily.     ciprofloxacin (CIPRO) 500 MG tablet Take 500 mg by mouth 2 (two) times daily.     clonazePAM (KLONOPIN) 0.5 MG tablet Take 0.25 mg by mouth daily as needed for anxiety.     cyanocobalamin (,VITAMIN B-12,) 1000 MCG/ML injection Inject 1,000 mcg into the muscle every 30 (thirty) days.     gabapentin (NEURONTIN) 300 MG capsule Take 1 capsule (300 mg total) by mouth 3 (three) times daily. 90 capsule 0   ondansetron (ZOFRAN-ODT) 4 MG disintegrating tablet Take 1 tablet (4 mg total) by mouth every 8 (eight) hours as needed for nausea or vomiting. 8 tablet 0   pantoprazole (PROTONIX) 40 MG tablet Take 1 tablet (40 mg total) by mouth daily at 12 noon. 30 tablet 0   pantoprazole (PROTONIX) 40 MG tablet Take  1 tablet (40 mg total) by mouth 2 (two) times daily. 120 tablet 1   PARoxetine (PAXIL) 40 MG tablet Take 20 mg by mouth every morning. Anxiety and panic attacks     phenazopyridine (PYRIDIUM) 200 MG tablet Take 1 tablet (200 mg total) by mouth 3 (three) times daily as needed for pain. 14 tablet 0   promethazine (PHENERGAN) 12.5 MG tablet Take 1 tablet (12.5 mg total) by mouth every 8 (eight) hours as needed for nausea or vomiting. 20 tablet 0   No facility-administered medications prior to visit.    Allergies  Allergen Reactions   Escitalopram Other (See Comments)    Altered mental status.    Cymbalta [Duloxetine Hcl] Other (See Comments)    Severe mood changes    Levothyroxine Nausea Only   Lidocaine Hives and Rash    Review of Systems  Constitutional:  Negative for chills, diaphoresis, fatigue, fever and unexpected weight change.  Skin: Negative.       Objective:    Physical Exam Constitutional:      Appearance: Normal appearance.  HENT:     Head: Normocephalic and atraumatic.  Eyes:     Conjunctiva/sclera: Conjunctivae normal.  Cardiovascular:     Rate and Rhythm: Normal rate and regular rhythm.  Pulmonary:     Effort: Pulmonary effort is normal.     Breath sounds: Normal breath sounds.  Chest:     Chest wall: No mass, lacerations, deformity, swelling, tenderness, crepitus or edema. There is no dullness to percussion.  Breasts:    Right: Normal. No swelling, bleeding, inverted nipple, mass, nipple discharge, skin change or tenderness.     Left: Normal. No swelling, bleeding, inverted nipple, mass, nipple discharge, skin change or tenderness.  Lymphadenopathy:     Upper Body:     Left upper body: Axillary adenopathy present.  Skin:    General: Skin is warm and dry.  Neurological:     General: No focal deficit present.     Mental Status: She is alert. Mental status is at baseline.  Psychiatric:        Mood and Affect: Mood normal.        Behavior: Behavior normal.    BP 104/62    Pulse 98    Temp 98 F (36.7 C) (Oral)    Resp 16    Ht '5\' 8"'  (1.727 m)    Wt 208 lb 8 oz (94.6 kg)    SpO2 99%    BMI 31.70 kg/m  Wt Readings from Last 3 Encounters:  10/12/20 224 lb 3.2 oz (101.7 kg)  10/11/20 223 lb (101.2 kg)  09/27/20 224 lb 3.2 oz (101.7 kg)    Health Maintenance Due  Topic Date Due   COVID-19 Vaccine (1) Never done   Hepatitis C Screening  Never done   PAP-Cervical Cytology Screening  Never done   PAP SMEAR-Modifier  Never done   TETANUS/TDAP  12/26/2014   INFLUENZA VACCINE  Never done    There are no preventive care reminders to display for this patient.   Lab Results  Component Value Date   TSH 1.93 01/15/2019    Lab Results  Component Value Date   WBC 12.5 (H) 10/11/2020   HGB 13.3 10/11/2020   HCT 39.7 10/11/2020   MCV 91.1 10/11/2020   PLT 274 10/11/2020   Lab Results  Component Value Date   NA 140 10/11/2020   K 3.8 10/11/2020   CO2 26 10/11/2020   GLUCOSE 107 (H)  10/11/2020   BUN 15 10/11/2020   CREATININE 1.21 (H) 10/11/2020   BILITOT 0.7 10/11/2020   ALKPHOS 75 10/11/2020   AST 45 (H) 10/11/2020   ALT 75 (H) 10/11/2020   PROT 6.9 10/11/2020   ALBUMIN 4.3 10/11/2020   CALCIUM 9.6 10/11/2020   ANIONGAP 9 10/11/2020   EGFR 104 09/27/2020   Lab Results  Component Value Date   CHOL 155 12/01/2018   Lab Results  Component Value Date   HDL 44 (L) 12/01/2018   Lab Results  Component Value Date   LDLCALC 98 12/01/2018   Lab Results  Component Value Date   TRIG 51 12/01/2018   Lab Results  Component Value Date   CHOLHDL 3.5 12/01/2018   Lab Results  Component Value Date   HGBA1C 5.1 12/01/2018       Assessment & Plan:   1. Left axillary swelling/Axillary lump, left: No signs of infection, breast exam normal. 1x1 inch mass palpated in left axillary region, differential includes lipoma, deep abscess, reactive lymph node. [place] ordered today for further evaluation. CBC in November normal. Follow up in 3 months for recheck and routine medical follow up.   - MM 3D SCREEN BREAST UNI LEFT; Future   Teodora Medici, DO

## 2021-03-31 NOTE — Patient Instructions (Addendum)
It was great seeing you today!  Plan discussed at today's visit: -Ultrasound ordered today   Follow up in: 3 months   Take care and let us know if you have any questions or concerns prior to your next visit.  Dr. Caralee Ates

## 2021-04-03 ENCOUNTER — Other Ambulatory Visit: Payer: Self-pay | Admitting: Internal Medicine

## 2021-04-06 ENCOUNTER — Ambulatory Visit
Admission: RE | Admit: 2021-04-06 | Discharge: 2021-04-06 | Disposition: A | Discharge status: Home / Self Care | Payer: 59 | Source: Ambulatory Visit | Attending: Internal Medicine | Admitting: Internal Medicine

## 2021-04-06 ENCOUNTER — Other Ambulatory Visit: Payer: Self-pay

## 2021-04-06 DIAGNOSIS — R2232 Localized swelling, mass and lump, left upper limb: Secondary | ICD-10-CM | POA: Insufficient documentation

## 2021-04-06 DIAGNOSIS — M7989 Other specified soft tissue disorders: Secondary | ICD-10-CM | POA: Diagnosis present

## 2021-04-07 NOTE — Addendum Note (Signed)
Addended by: Margarita Mail on: 04/07/2021 01:06 PM   Modules accepted: Orders

## 2021-06-29 ENCOUNTER — Ambulatory Visit: Payer: 59 | Admitting: Family Medicine

## 2021-06-29 ENCOUNTER — Encounter: Payer: Self-pay | Admitting: Internal Medicine

## 2021-06-29 ENCOUNTER — Ambulatory Visit (INDEPENDENT_AMBULATORY_CARE_PROVIDER_SITE_OTHER): Payer: 59 | Admitting: Internal Medicine

## 2021-06-29 VITALS — HR 104 | Temp 98.3°F | Resp 16 | Wt 212.0 lb

## 2021-06-29 DIAGNOSIS — F419 Anxiety disorder, unspecified: Secondary | ICD-10-CM

## 2021-06-29 DIAGNOSIS — R599 Enlarged lymph nodes, unspecified: Secondary | ICD-10-CM | POA: Diagnosis not present

## 2021-06-29 DIAGNOSIS — F330 Major depressive disorder, recurrent, mild: Secondary | ICD-10-CM | POA: Diagnosis not present

## 2021-06-29 DIAGNOSIS — E038 Other specified hypothyroidism: Secondary | ICD-10-CM

## 2021-06-29 DIAGNOSIS — R41840 Attention and concentration deficit: Secondary | ICD-10-CM

## 2021-06-29 DIAGNOSIS — K219 Gastro-esophageal reflux disease without esophagitis: Secondary | ICD-10-CM

## 2021-06-29 MED ORDER — FLUOXETINE HCL 10 MG PO TABS: 10.0000 mg | ORAL_TABLET | Freq: Every day | ORAL | 3 refills | Status: DC

## 2021-06-29 NOTE — Progress Notes (Signed)
? ?Established Patient Office Visit ? ?Subjective   ?Patient ID: Yvonne Little, female    DOB: [birth year]  Age: 26 y.o. MRN: [id number] ? ?Chief Complaint  ?Patient presents with  ? Follow-up  ? Depression  ?  Stopped paxil b/c was giving her GERD, wants refrral to psych, for depression   ? ? ?HPI ?Yvonne Little is a 26 year old female here for follow up on chronic medical conditions.  ? ?Left Axillary Mass: ?-First present 3 months ago, concerned because of family history of lymphoma ?-No pain, skin changes, etc ?-Breast US from 04/06/21 showing a "probably benign lymph nodes with a slight cortical bulge". Radiology recommendations to repeat US in the 3 months to recheck, however the patient states that this resolved after brief viral illness and she does not want to do a repeat ultrasound due to cost. ?-CBC from November 2022 was unremarkable  ? ?Subclinical Hypothyroidism: ?-TSH in 11/22 4.447 ?-[facility] but this provider left, has appointment [place] coming up ?-Currently on Armour Thyroid 90 mg daily, which controls symptoms   ? ?GERD: ?-Currently on Protonix 40 mg as needed, started while on Paxil. ?-Symptoms include reflux, occasional epigastric pain.  Denies nausea, vomiting.  Appetite good.  Weight stable. ? ?MDD: ?-Mood status: exacerbated ?-Current treatment: Nothing currently  ?-Satisfied with current treatment?: no ?-Symptom severity: moderate  ?Previous psychiatric medications: Celexa caused severe weakness, Cymbalta caused severe anger and Paxil caused severe heartburn symptoms and GI pain ?Depressed mood: yes ?Anxious mood: yes ?Anhedonia:  Occasionally ?Fatigue: yes ? ? ?  06/29/2021  ?  3:01 PM 03/31/2021  ? 12:57 PM 10/12/2020  ?  2:47 PM 09/27/2020  ?  2:14 PM 01/15/2019  ?  1:19 PM  ?Depression screen PHQ 2/9  ?Decreased Interest 2 0 0 0 1  ?Down, Depressed, Hopeless 2 0 0 0 1  ?PHQ - 2 Score 4 0 0 0 2  ?Altered sleeping 3 0 0 2 1   ?Tired, decreased energy 2 0 0 1 1  ?Change in appetite 1 0 0 0 2  ?Feeling bad or failure about yourself  1 0 0 0 0  ?Trouble concentrating 3 0 0 0 0  ?Moving slowly or fidgety/restless 1 0 0 0 0  ?Suicidal thoughts 0 0 0 0 0  ?PHQ-9 Score 15 0 0 3 6  ?Difficult doing work/chores Very difficult Not difficult at all Not difficult at all Not difficult at all Somewhat difficult  ? ?Health Maintenance: ?-Blood work UTD ?-Pap due. Following with gynecology  ? ?Past Medical History:  ?Diagnosis Date  ? Anxiety   ? Articular disc disorder of temporomandibular joint, 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  ? Depression   ? Factor 5 Leiden mutation, heterozygous (HCC)   ? Thyroid disease   ? hx of hypo  ? ?History reviewed. No pertinent surgical history. ?Social History  ? ?Tobacco Use  ? Smoking status: Never  ? Smokeless tobacco: Never  ?Vaping Use  ? Vaping Use: Every day  ? Substances: Nicotine  ?Substance Use Topics  ? Alcohol use: Not Currently  ? Drug use: Never  ? ?Allergies  ?Allergen Reactions  ? Escitalopram Other (See Comments)  ?  Altered mental status. ?  ? Cymbalta [Duloxetine Hcl] Other (See Comments)  ?  Severe mood changes  ? Levothyroxine Nausea Only  ? Lidocaine Hives and Rash  ? ?  ? ?Review of Systems  ?Constitutional:  Negative for chills and fever.  ?  Respiratory:  Negative for cough.   ?Cardiovascular:  Negative for chest pain.  ?Gastrointestinal:  Positive for heartburn. Negative for abdominal pain, nausea and vomiting.  ?Psychiatric/Behavioral:  Positive for depression. The patient is nervous/anxious.   ? ?  ?Objective:  ?  ? ?Pulse (!) 104   Temp 98.3 ?F (36.8 ?C)   Resp 16   Wt 212 lb (96.2 kg)   SpO2 99%   BMI 32.23 kg/m?  ?BP Readings from Last 3 Encounters:  ?03/31/21 104/62  ?10/12/20 126/82  ?10/11/20 111/85  ? ?Wt Readings from Last 3 Encounters:  ?06/29/21 212 lb (96.2 kg)  ?03/31/21 208 lb 8 oz (94.6 kg)  ?10/12/20 224 lb 3.2 oz (101.7 kg)  ? ?  ? ?Physical Exam ?Constitutional:   ?    Appearance: Normal appearance.  ?HENT:  ?   Head: Normocephalic and atraumatic.  ?Eyes:  ?   Conjunctiva/sclera: Conjunctivae normal.  ?Neck:  ?   Comments: Thyroid mildly enlarged but symmetric ?Cardiovascular:  ?   Rate and Rhythm: Normal rate and regular rhythm.  ?Pulmonary:  ?   Effort: Pulmonary effort is normal.  ?   Breath sounds: Normal breath sounds.  ?Musculoskeletal:  ?   Cervical back: No tenderness.  ?   Right lower leg: No edema.  ?   Left lower leg: No edema.  ?Lymphadenopathy:  ?   Cervical: No cervical adenopathy.  ?Skin: ?   General: Skin is warm and dry.  ?Neurological:  ?   General: No focal deficit present.  ?   Mental Status: She is alert. Mental status is at baseline.  ?Psychiatric:     ?   Mood and Affect: Mood normal.     ?   Behavior: Behavior normal.  ? ? ?Last CBC ?Lab Results  ?Component Value Date  ? WBC 12.5 (H) 10/11/2020  ? HGB 13.3 10/11/2020  ? HCT 39.7 10/11/2020  ? MCV 91.1 10/11/2020  ? MCH 30.5 10/11/2020  ? RDW 13.7 10/11/2020  ? PLT 274 10/11/2020  ? ?Last metabolic panel ?Lab Results  ?Component Value Date  ? GLUCOSE 107 (H) 10/11/2020  ? NA 140 10/11/2020  ? K 3.8 10/11/2020  ? CL 105 10/11/2020  ? CO2 26 10/11/2020  ? BUN 15 10/11/2020  ? CREATININE 1.21 (H) 10/11/2020  ? GFRNONAA >60 10/11/2020  ? CALCIUM 9.6 10/11/2020  ? PHOS 3.4 08/28/2020  ? PROT 6.9 10/11/2020  ? ALBUMIN 4.3 10/11/2020  ? BILITOT 0.7 10/11/2020  ? ALKPHOS 75 10/11/2020  ? AST 45 (H) 10/11/2020  ? ALT 75 (H) 10/11/2020  ? ANIONGAP 9 10/11/2020  ? ?Last lipids ?Lab Results  ?Component Value Date  ? CHOL 155 12/01/2018  ? HDL 44 (L) 12/01/2018  ? LDLCALC 98 12/01/2018  ? TRIG 51 12/01/2018  ? CHOLHDL 3.5 12/01/2018  ? ?Last hemoglobin A1c ?Lab Results  ?Component Value Date  ? HGBA1C 5.1 12/01/2018  ? ?Last thyroid functions ?Lab Results  ?Component Value Date  ? TSH 1.93 01/15/2019  ? ?Last vitamin D ?No results found for: 25OHVITD2, 25OHVITD3, VD25OH ?Last vitamin B12 and Folate ?Lab Results   ?Component Value Date  ? DXAJOINO67 192 08/25/2020  ? FOLATE 10.9 08/25/2020  ? ?  ? ?The ASCVD Risk score (Arnett DK, et al., 2019) failed to calculate for the following reasons: ?  The 2019 ASCVD risk score is only valid for ages 70 to 66 ? ?  ?Assessment & Plan:  ? ?Problem List Items Addressed This Visit   ? ?  ?  Digestive  ? Gastroesophageal reflux disease  ?  Exacerbated since being on Paxil but has been better since the discontinuation of Paxil.  Continue Protonix 40 mg daily. ? ?  ?  ?  ? Endocrine  ? Subclinical hypothyroidism  ?  Stable.  Reviewed last set of labs from November, all within normal limits.  She is currently on Armour Thyroid 90 mg daily.  Planning on following up with endocrinology in [place] soon. ? ?  ?  ?  ? Immune and Lymphatic  ? Reactive lymphadenopathy  ?  Left axillary lymphadenopathy from February.  Ultrasound was obtained which showed "probably benign lymph nodes with slight cortical bulge".  Radiology at the time recommended follow-up in 3 months, however the patient declines follow-up imaging secondary to cost.  The lymphadenopathy resolved about a week after the ultrasound was done.  We discussed the risks of not repeating the imaging and the patient understands risks and still does not want to repeat imaging.  CBC from November unremarkable.  No cervical lymphadenopathy today on exam. ? ?  ?  ?  ? Other  ? Mild episode of recurrent major depressive disorder (HCC) - Primary  ?  Discontinue Paxil, start Prozac 10 mg daily.  Referral placed to psychiatry. ? ?  ?  ? Relevant Medications  ? FLUoxetine (PROZAC) 10 MG tablet  ? Other Relevant Orders  ? Ambulatory referral to Psychiatry  ? Anxiety disorder, unspecified  ?  Uncontrolled.  Referral placed to psychiatry at the patient's request.  She did not do well with Celexa, Paxil or Cymbalta in the past.  We will trial low-dose Prozac.  She was started on Prozac 10 mg and will follow-up here in 6 weeks for recheck. ? ?  ?  ?  Relevant Medications  ? FLUoxetine (PROZAC) 10 MG tablet  ? Other Relevant Orders  ? Ambulatory referral to Psychiatry  ? Attention and concentration deficit  ?  Referral placed to psychiatry at the request of the patient. ? ?

## 2021-06-29 NOTE — Assessment & Plan Note (Signed)
Exacerbated since being on Paxil but has been better since the discontinuation of Paxil.  Continue Protonix 40 mg daily. ?

## 2021-06-29 NOTE — Assessment & Plan Note (Signed)
Discontinue Paxil, start Prozac 10 mg daily.  Referral placed to psychiatry. ?

## 2021-06-29 NOTE — Assessment & Plan Note (Signed)
Uncontrolled.  Referral placed to psychiatry at the patient's request.  She did not do well with Celexa, Paxil or Cymbalta in the past.  We will trial low-dose Prozac.  She was started on Prozac 10 mg and will follow-up here in 6 weeks for recheck. ?

## 2021-06-29 NOTE — Assessment & Plan Note (Signed)
Referral placed to psychiatry at the request of the patient. ?

## 2021-06-29 NOTE — Assessment & Plan Note (Signed)
Stable.  Reviewed last set of labs from November, all within normal limits.  She is currently on Armour Thyroid 90 mg daily.  Planning on following up with endocrinology in [place] [employer] soon. ?

## 2021-06-29 NOTE — Assessment & Plan Note (Signed)
Left axillary lymphadenopathy from February.  Ultrasound was obtained which showed "probably benign lymph nodes with slight cortical bulge".  Radiology at the time recommended follow-up in 3 months, however the patient declines follow-up imaging secondary to cost.  The lymphadenopathy resolved about a week after the ultrasound was done.  We discussed the risks of not repeating the imaging and the patient understands risks and still does not want to repeat imaging.  CBC from November unremarkable.  No cervical lymphadenopathy today on exam. ?

## 2021-06-29 NOTE — Patient Instructions (Addendum)
It was great seeing you today! ? ?Plan discussed at today's visit: ?-Referral placed to Psychiatry placed today ?-Start Prozac 10 mg daily  ?-No other changes to medications made today  ? ?Follow up in: 6 weeks  ? ?Take care and let us know if you have any questions or concerns prior to your next visit. ? ?Dr. Caralee Ates ? ?

## 2021-07-08 ENCOUNTER — Ambulatory Visit
Admission: EM | Admit: 2021-07-08 | Discharge: 2021-07-08 | Disposition: A | Discharge status: Home / Self Care | Payer: 59 | Attending: Family Medicine | Admitting: Family Medicine

## 2021-07-08 DIAGNOSIS — H66002 Acute suppurative otitis media without spontaneous rupture of ear drum, left ear: Secondary | ICD-10-CM | POA: Diagnosis not present

## 2021-07-08 DIAGNOSIS — H6983 Other specified disorders of Eustachian tube, bilateral: Secondary | ICD-10-CM | POA: Diagnosis not present

## 2021-07-08 DIAGNOSIS — R110 Nausea: Secondary | ICD-10-CM

## 2021-07-08 MED ORDER — AMOXICILLIN 875 MG PO TABS: 875.0000 mg | ORAL_TABLET | Freq: Two times a day (BID) | ORAL | 0 refills | Status: DC

## 2021-07-08 MED ORDER — ONDANSETRON 4 MG PO TBDP
4.0000 mg | ORAL_TABLET | Freq: Once | ORAL | Status: AC
  Administered 2021-07-08: 4 mg via ORAL

## 2021-07-08 MED ORDER — PROMETHAZINE HCL 25 MG PO TABS: 25.0000 mg | ORAL_TABLET | Freq: Four times a day (QID) | ORAL | 0 refills | Status: DC | PRN

## 2021-07-08 MED ORDER — AZELASTINE HCL 0.1 % NA SOLN: 1.0000 | Freq: Two times a day (BID) | NASAL | 0 refills | Status: DC

## 2021-07-08 NOTE — ED Provider Notes (Signed)
?[facility] ? ? ? ?CSN: [id number] ?Arrival date & time: 07/08/21  7681 ? ? ?  ? ?History   ?Chief Complaint ?Chief Complaint  ?Patient presents with  ? Nausea  ? Ear Pain  ? ?HPI ?Towana Stenglein is a 26 y.o. female.  ? ?Presenting today with several day history of progressively worsening left ear pain, pressure, muffled hearing.  Now also having a bit of nausea and dizziness associated.  Denies fever, chills, drainage, headache, vision change, weakness, vomiting, diarrhea.  Took some ibuprofen last night with mild temporary relief of symptoms. ? ?Past Medical History:  ?Diagnosis Date  ? Anxiety   ? Articular disc disorder of temporomandibular joint, 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  ? Depression   ? Factor 5 Leiden mutation, heterozygous (HCC)   ? Thyroid disease   ? hx of hypo  ? ? ?Patient Active Problem List  ? Diagnosis Date Noted  ? Reactive lymphadenopathy 06/29/2021  ? Gastroesophageal reflux disease 06/29/2021  ? UTI (urinary tract infection) 08/25/2020  ? Intractable back pain 08/25/2020  ? COVID 08/24/2020  ? Mild episode of recurrent major depressive disorder (HCC) 12/01/2018  ? Class 1 obesity with body mass index (BMI) of 31.0 to 31.9 in adult 12/01/2018  ? Deficiency of other vitamins 12/31/2016  ? Anxiety disorder, unspecified 12/06/2016  ? Subclinical hypothyroidism 09/12/2016  ? Attention and concentration deficit 08/24/2015  ? Adjustment disorder with anxiety 07/27/2015  ? ? ?History reviewed. No pertinent surgical history. ? ?OB History   ?No obstetric history on file. ?  ? ? ? ?Home Medications   ? ?Prior to Admission medications   ?Medication Sig Start Date End Date Taking? Authorizing Provider  ?amoxicillin (AMOXIL) 875 MG tablet Take 1 tablet (875 mg total) by mouth 2 (two) times daily. 07/08/21  Yes Particia Nearing, PA-C  ?azelastine (ASTELIN) 0.1 % nasal spray Place 1 spray into both nostrils 2 (two) times daily. Use in each nostril as directed 07/08/21  Yes Particia Nearing, PA-C  ?promethazine (PHENERGAN) 25 MG tablet Take 1 tablet (25 mg total) by mouth every 6 (six) hours as needed for nausea or vomiting. May cause significant drowsiness 07/08/21  Yes Particia Nearing, PA-C  ?ARMOUR THYROID 90 MG tablet Take 90 mg by mouth daily. 03/26/20   [provider]  ?clonazePAM (KLONOPIN) 0.5 MG tablet Take 0.25 mg by mouth daily as needed for anxiety. 08/02/20   [provider]  ?FLUoxetine (PROZAC) 10 MG tablet Take 1 tablet (10 mg total) by mouth daily. 06/29/21   Margarita Mail, DO  ?ondansetron (ZOFRAN-ODT) 4 MG disintegrating tablet Take 1 tablet (4 mg total) by mouth every 8 (eight) hours as needed for nausea or vomiting. 09/02/20   Benjiman Core, MD  ?pantoprazole (PROTONIX) 40 MG tablet Take 1 tablet (40 mg total) by mouth daily at 12 noon. 08/29/20   Ghimire, Werner Lean, MD  ?pantoprazole (PROTONIX) 40 MG tablet Take 1 tablet (40 mg total) by mouth 2 (two) times daily. 09/27/20   [name], PA-C  ?promethazine (PHENERGAN) 12.5 MG tablet Take 1 tablet (12.5 mg total) by mouth every 8 (eight) hours as needed for nausea or vomiting. 10/12/20   Caro Laroche, DO  ? ? ?Family History ?Family History  ?Problem Relation Age of Onset  ? Hyperlipidemia Mother   ? Hypertension Maternal Grandmother   ? Hyperlipidemia Maternal Grandmother   ? Hypertension Maternal Grandfather   ? Hyperlipidemia Maternal Grandfather   ? Stroke Maternal Grandfather   ?  Diabetes Paternal Grandmother   ? Non-Hodgkin's lymphoma Paternal Grandmother   ? Melanoma Paternal Grandfather   ? ? ?Social History ?Social History  ? ?Tobacco Use  ? Smoking status: Never  ? Smokeless tobacco: Never  ?Vaping Use  ? Vaping Use: Every day  ? Substances: Nicotine  ?Substance Use Topics  ? Alcohol use: Not Currently  ? Drug use: Never  ? ? ? ?Allergies   ?Escitalopram, Cymbalta [duloxetine hcl], Levothyroxine, and Lidocaine ? ? ?Review of Systems ?Review of Systems ?Per HPI ? ?Physical  Exam ?Triage Vital Signs ?ED Triage Vitals [07/08/21 1025]  ?Enc Vitals Group  ?   BP 110/75  ?   Pulse Rate 89  ?   Resp 16  ?   Temp 98.5 ?F (36.9 ?C)  ?   Temp Source Oral  ?   SpO2 98 %  ?   Weight   ?   Height   ?   Head Circumference   ?   Peak Flow   ?   Pain Score 0  ?   Pain Loc   ?   Pain Edu?   ?   Excl. in GC?   ? ?No data found. ? ?Updated Vital Signs ?BP 110/75 (BP Location: Left Arm)   Pulse 89   Temp 98.5 ?F (36.9 ?C) (Oral)   Resp 16   SpO2 98%  ? ?Visual Acuity ?Right Eye Distance:   ?Left Eye Distance:   ?Bilateral Distance:   ? ?Right Eye Near:   ?Left Eye Near:    ?Bilateral Near:    ? ?Physical Exam ?Vitals and nursing note reviewed.  ?Constitutional:   ?   Appearance: Normal appearance. She is not ill-appearing.  ?HENT:  ?   Head: Atraumatic.  ?   Ears:  ?   Comments: Significant bilateral middle ear effusion, left TM erythematous, edematous ?   Nose: Nose normal.  ?   Mouth/Throat:  ?   Mouth: Mucous membranes are moist.  ?   Pharynx: Oropharynx is clear. No posterior oropharyngeal erythema.  ?Eyes:  ?   Extraocular Movements: Extraocular movements intact.  ?   Conjunctiva/sclera: Conjunctivae normal.  ?Cardiovascular:  ?   Rate and Rhythm: Normal rate and regular rhythm.  ?   Heart sounds: Normal heart sounds.  ?Pulmonary:  ?   Effort: Pulmonary effort is normal.  ?   Breath sounds: Normal breath sounds. No wheezing or rales.  ?Abdominal:  ?   General: Bowel sounds are normal. There is no distension.  ?   Palpations: Abdomen is soft.  ?   Tenderness: There is no abdominal tenderness. There is no guarding.  ?Musculoskeletal:     ?   General: Normal range of motion.  ?   Cervical back: Normal range of motion and neck supple.  ?Skin: ?   General: Skin is warm and dry.  ?Neurological:  ?   Mental Status: She is alert and oriented to person, place, and time.  ?   Motor: No weakness.  ?   Gait: Gait normal.  ?Psychiatric:     ?   Mood and Affect: Mood normal.     ?   Thought Content: Thought  content normal.     ?   Judgment: Judgment normal.  ? ? ? ?UC Treatments / Results  ?Labs ?(all labs ordered are listed, but only abnormal results are displayed) ?Labs Reviewed - No data to display ? ?EKG ? ? ?Radiology ?No results found. ? ?  Procedures ?Procedures (including critical care time) ? ?Medications Ordered in UC ?Medications  ?ondansetron (ZOFRAN-ODT) disintegrating tablet 4 mg (4 mg Oral Given 07/08/21 1104)  ? ? ?Initial Impression / Assessment and Plan / UC Course  ?I have reviewed the triage vital signs and the nursing notes. ? ?Pertinent labs & imaging results that were available during my care of the patient were reviewed by me and considered in my medical decision making (see chart for details). ? ?  ? ?Zofran given for active nausea in clinic, patient prefers Phenergan so Phenergan prescription sent to pharmacy in addition to Astelin nasal spray to help with eustachian tube dysfunction, amoxicillin for developing left ear infection.  Discussed supportive over-the-counter medications and home care.  Strict return precautions given for worsening symptoms. ? ?Final Clinical Impressions(s) / UC Diagnoses  ? ?Final diagnoses:  ?Non-recurrent acute suppurative otitis media of left ear without spontaneous rupture of tympanic membrane  ?Nausea without vomiting  ?Eustachian tube dysfunction, bilateral  ? ?Discharge Instructions   ?None ?  ? ?ED Prescriptions   ? ? Medication Sig Dispense Auth. Provider  ? amoxicillin (AMOXIL) 875 MG tablet Take 1 tablet (875 mg total) by mouth 2 (two) times daily. 20 tablet Particia NearingLane, Katesha Eichel Elizabeth, [place]-C  ? azelastine (ASTELIN) 0.1 % nasal spray Place 1 spray into both nostrils 2 (two) times daily. Use in each nostril as directed 30 mL Particia NearingLane, Alexcia Schools Elizabeth, PA-C  ? promethazine (PHENERGAN) 25 MG tablet Take 1 tablet (25 mg total) by mouth every 6 (six) hours as needed for nausea or vomiting. May cause significant drowsiness 10 tablet Particia NearingLane, Jackilyn Umphlett Elizabeth, [place]-C  ? ?   ? ?PDMP not reviewed this encounter. ?  ?Particia NearingLane, Gerlean Cid Elizabeth, PA-C ?07/08/21 1127 ? ?

## 2021-07-08 NOTE — ED Triage Notes (Signed)
Pt reports ear pain for several days.  ?Pt reports some nausea and dizziness. ?

## 2021-07-21 ENCOUNTER — Telehealth: Payer: Self-pay | Admitting: Internal Medicine

## 2021-07-21 DIAGNOSIS — F419 Anxiety disorder, unspecified: Secondary | ICD-10-CM

## 2021-07-21 DIAGNOSIS — F330 Major depressive disorder, recurrent, mild: Secondary | ICD-10-CM

## 2021-07-25 NOTE — Telephone Encounter (Signed)
Original Rx- 06/29/21 #90 3RF- no change needed Requested Prescriptions  Pending Prescriptions Disp Refills  . FLUoxetine (PROZAC) 10 MG tablet [Pharmacy Med Name: FLUOXETINE HCL 10 MG TABLET] 90 tablet 4    Sig: TAKE 1 TABLET BY MOUTH EVERY DAY     Psychiatry:  Antidepressants - SSRI Passed - 07/21/2021  8:30 AM      Passed - Completed PHQ-2 or PHQ-9 in the last 360 days      Passed - Valid encounter within last 6 months    Recent Outpatient Visits          3 weeks ago Mild episode of recurrent major depressive disorder [facility])   [facility] Margarita Mail, DO   3 months ago Axillary lump, left   [facility] Margarita Mail, DO   9 months ago Urinary tract infection without hematuria, site unspecified   [facility] Dense M, DO   10 months ago Pain of upper abdomen   [facility] [name], PA-C   2 years ago Hypothyroidism, unspecified type   [facility] [name], PA-C      Future Appointments            In 2 weeks Margarita Mail, DO [facility]

## 2021-07-30 ENCOUNTER — Other Ambulatory Visit: Payer: Self-pay | Admitting: Family Medicine

## 2021-07-31 NOTE — Telephone Encounter (Signed)
Requested medication (s) are due for refill today: yes  Requested medication (s) are on the active medication list: yes  Last refill:  07/08/21  Future visit scheduled: yes  Notes to clinic:  Unable to refill per protocol, last refill by another provider.      Requested Prescriptions  Pending Prescriptions Disp Refills   Azelastine HCl 137 MCG/SPRAY SOLN [Pharmacy Med Name: AZELASTINE 0.1% (137 MCG) SPRY]      Sig: PLACE 1 SPRAY INTO BOTH NOSTRILS 2 (TWO) TIMES DAILY. USE IN EACH NOSTRIL AS DIRECTED     There is no refill protocol information for this order

## 2021-08-01 ENCOUNTER — Encounter: Payer: 59 | Admitting: Internal Medicine

## 2021-08-10 ENCOUNTER — Ambulatory Visit (INDEPENDENT_AMBULATORY_CARE_PROVIDER_SITE_OTHER): Payer: 59 | Admitting: Internal Medicine

## 2021-08-10 ENCOUNTER — Encounter: Payer: Self-pay | Admitting: Internal Medicine

## 2021-08-10 VITALS — BP 124/82 | HR 78 | Temp 98.2°F | Resp 16 | Ht 68.0 in | Wt 213.6 lb

## 2021-08-10 DIAGNOSIS — R41840 Attention and concentration deficit: Secondary | ICD-10-CM

## 2021-08-10 DIAGNOSIS — H6983 Other specified disorders of Eustachian tube, bilateral: Secondary | ICD-10-CM | POA: Diagnosis not present

## 2021-08-10 DIAGNOSIS — E038 Other specified hypothyroidism: Secondary | ICD-10-CM

## 2021-08-10 DIAGNOSIS — K219 Gastro-esophageal reflux disease without esophagitis: Secondary | ICD-10-CM

## 2021-08-10 DIAGNOSIS — F330 Major depressive disorder, recurrent, mild: Secondary | ICD-10-CM

## 2021-08-10 DIAGNOSIS — H6502 Acute serous otitis media, left ear: Secondary | ICD-10-CM

## 2021-08-10 MED ORDER — CEFDINIR 300 MG PO CAPS: 300.0000 mg | ORAL_CAPSULE | Freq: Two times a day (BID) | ORAL | 0 refills | Status: AC

## 2021-08-10 MED ORDER — FLUTICASONE PROPIONATE 50 MCG/ACT NA SUSP: 2.0000 | Freq: Every day | NASAL | 6 refills | Status: DC

## 2021-08-10 MED ORDER — VORTIOXETINE HBR 5 MG PO TABS: 5.0000 mg | ORAL_TABLET | Freq: Every day | ORAL | 1 refills | Status: DC

## 2021-08-10 NOTE — Progress Notes (Signed)
Established Patient Office Visit  Subjective   Patient ID: Markita Stcharles, female    DOB: 02/06/1996  Age: 26 y.o. MRN: [id number]  Chief Complaint  Patient presents with   Follow-up   Depression   Gastroesophageal Reflux    HPI Ertha Nabor is a 26 year old female here for follow up on chronic medical conditions.   Left Ear Pain: -Recently diagnosed with otitis media and otitis externa in the left ear.  She was initially started on amoxicillin, however this was not enough so she was started on Augmentin.  She finished this about 1-1/2 weeks ago.  She is also given an otic steroid and antibiotic combination drop. -Today she states that she still having left ear pain.  She denies fevers, drainage from the ear, hearing changes or changes in tinnitus. -She does endorse pressure-like sensation in both ears.  She states this is chronic for her but does get worse when she flies on airplanes.  She is worried because she has several trips planned this summer and she would like something to help her with the pressure changes in her ears.  Subclinical Hypothyroidism: -TSH in 11/22 4.447 -Had been following with Endocrinology at [facility] but this provider left, has appointment with Endocrinology in [place] coming up -Currently on Armour Thyroid 90 mg daily, which controls symptoms    GERD: -Currently on Protonix 40 mg as needed, started while on Paxil. -Symptoms include reflux, occasional epigastric pain.  Denies nausea, vomiting.  Appetite good.  Weight stable.  MDD: -Mood status: uncontrolled -Current treatment: Started on Prozac 10 at LOV but does not like it, states it is making her feel more apathetic and tired. -Satisfied with current treatment?: no -Symptom severity: moderate  Previous psychiatric medications: Cymbalta caused severe anger and Paxil caused severe heartburn symptoms and GI pain. Lexapro had severe weakness after only 1 pill.  Depressed mood: yes Anxious  mood: yes Anhedonia:  Occasionally Fatigue: yes     08/10/2021    2:29 PM 06/29/2021    3:01 PM 03/31/2021   12:57 PM 10/12/2020    2:47 PM 09/27/2020    2:14 PM  Depression screen PHQ 2/9  Decreased Interest 2 2 0 0 0  Down, Depressed, Hopeless 1 2 0 0 0  PHQ - 2 Score 3 4 0 0 0  Altered sleeping 3 3 0 0 2  Tired, decreased energy 2 2 0 0 1  Change in appetite 3 1 0 0 0  Feeling bad or failure about yourself  0 1 0 0 0  Trouble concentrating 1 3 0 0 0  Moving slowly or fidgety/restless 1 1 0 0 0  Suicidal thoughts 0 0 0 0 0  PHQ-9 Score 13 15 0 0 3  Difficult doing work/chores Very difficult Very difficult Not difficult at all Not difficult at all Not difficult at all   Health Maintenance: -Blood work UTD -Pap due. Following with gynecology   Past Medical History:  Diagnosis Date   Anxiety    Articular disc disorder of temporomandibular joint, 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   Depression    Factor 5 Leiden mutation, heterozygous (HCC)    Thyroid disease    hx of hypo   No past surgical history on file. Social History   Tobacco Use   Smoking status: Never   Smokeless tobacco: Never  Vaping Use   Vaping Use: Every day   Substances: Nicotine  Substance Use Topics   Alcohol use: Not Currently   Drug  use: Never   Allergies  Allergen Reactions   Escitalopram Other (See Comments)    Altered mental status.    Cymbalta [Duloxetine Hcl] Other (See Comments)    Severe mood changes   Levothyroxine Nausea Only   Lidocaine Hives and Rash      Review of Systems  Constitutional:  Negative for chills and fever.  HENT:  Positive for ear pain. Negative for congestion, ear discharge, hearing loss and tinnitus.   Respiratory:  Negative for cough.   Cardiovascular:  Negative for chest pain.  Gastrointestinal:  Positive for heartburn. Negative for abdominal pain, nausea and vomiting.  Psychiatric/Behavioral:  Positive for depression. The patient is nervous/anxious.        Objective:     BP 124/82   Pulse 78   Temp 98.2 F (36.8 C)   Resp 16   Ht 5\' 8"  (1.727 m)   Wt 213 lb 9.6 oz (96.9 kg)   SpO2 99%   BMI 32.48 kg/m  BP Readings from Last 3 Encounters:  08/10/21 124/82  07/08/21 110/75  03/31/21 104/62   Wt Readings from Last 3 Encounters:  08/10/21 213 lb 9.6 oz (96.9 kg)  06/29/21 212 lb (96.2 kg)  03/31/21 208 lb 8 oz (94.6 kg)      Physical Exam Constitutional:      Appearance: Normal appearance.  HENT:     Head: Normocephalic and atraumatic.     Right Ear: Ear canal and external ear normal.     Left Ear: Ear canal and external ear normal.     Ears:     Comments: Left otitis media present, right TM slightly bulging consistent with eustachian tube dysfunction. Eyes:     Conjunctiva/sclera: Conjunctivae normal.  Neck:     Comments: Thyroid mildly enlarged but symmetric Cardiovascular:     Rate and Rhythm: Normal rate and regular rhythm.  Pulmonary:     Effort: Pulmonary effort is normal.     Breath sounds: Normal breath sounds.  Musculoskeletal:     Right lower leg: No edema.     Left lower leg: No edema.  Skin:    General: Skin is warm and dry.  Neurological:     General: No focal deficit present.     Mental Status: She is alert. Mental status is at baseline.  Psychiatric:        Mood and Affect: Mood normal.        Behavior: Behavior normal.     Last CBC Lab Results  Component Value Date   WBC 12.5 (H) 10/11/2020   HGB 13.3 10/11/2020   HCT 39.7 10/11/2020   MCV 91.1 10/11/2020   MCH 30.5 10/11/2020   RDW 13.7 10/11/2020   PLT 274 [id number]   Last metabolic panel Lab Results  Component Value Date   GLUCOSE 107 (H) 10/11/2020   NA 140 10/11/2020   K 3.8 10/11/2020   CL 105 10/11/2020   CO2 26 10/11/2020   BUN 15 10/11/2020   CREATININE 1.21 (H) 10/11/2020   GFRNONAA >60 10/11/2020   CALCIUM 9.6 10/11/2020   PHOS 3.4 08/28/2020   PROT 6.9 10/11/2020   ALBUMIN 4.3 10/11/2020   BILITOT 0.7  10/11/2020   ALKPHOS 75 10/11/2020   AST 45 (H) 10/11/2020   ALT 75 (H) 10/11/2020   ANIONGAP 9 10/11/2020   Last lipids Lab Results  Component Value Date   CHOL 155 12/01/2018   HDL 44 (L) 12/01/2018   LDLCALC 98 12/01/2018   TRIG  51 12/01/2018   CHOLHDL 3.5 12/01/2018   Last hemoglobin A1c Lab Results  Component Value Date   HGBA1C 5.1 12/01/2018   Last thyroid functions Lab Results  Component Value Date   TSH 1.93 01/15/2019   Last vitamin D No results found for: "25OHVITD2", "25OHVITD3", "VD25OH" Last vitamin B12 and Folate Lab Results  Component Value Date   VITAMINB12 192 08/25/2020   FOLATE 10.9 08/25/2020      The ASCVD Risk score (Arnett DK, et al., 2019) failed to calculate for the following reasons:   The 2019 ASCVD risk score is only valid for ages 54 to 9    Assessment & Plan:   1. Mild episode of recurrent major depressive disorder [facility]): Did not do well with Prozac, this medication will be discontinued.  We will start Trintellix 5 mg.  If this medication is too high and cost, consider Wellbutrin.  Follow-up in 6 weeks for recheck.  - vortioxetine HBr (TRINTELLIX) 5 MG TABS tablet; Take 1 tablet (5 mg total) by mouth daily.  Dispense: 30 tablet; Refill: 1  2. Attention and concentration deficit: New referral placed to attention specialist in .  - Ambulatory referral to Psychiatry  3. Acute serous otitis media of left ear, recurrence not specified: Left-sided otitis media has not resolved despite 2 prior courses of antibiotics.  We will treat with Omnicef 300 mg twice daily x5 days.  She will follow-up if symptoms worsen or fail to improve with treatment.  - cefdinir (OMNICEF) 300 MG capsule; Take 1 capsule (300 mg total) by mouth 2 (two) times daily for 5 days.  Dispense: 10 capsule; Refill: 0  4. Eustachian tube dysfunction, bilateral: Treat with nasal saline and nasal steroid 2 sprays on each side twice daily.  - fluticasone (FLONASE)  50 MCG/ACT nasal spray; Place 2 sprays into both nostrils daily.  Dispense: 16 g; Refill: 6  5. Subclinical hypothyroidism: Stable.  Not currently on any medications.  Plan to recheck TSH in November.  6. Gastroesophageal reflux disease, unspecified whether esophagitis present: Stable.  Continue Protonix 40 mg daily.   Return in about 6 weeks (around 09/21/2021).    Margarita Mail, DO

## 2021-08-10 NOTE — Patient Instructions (Signed)
It was great seeing you today!  Plan discussed at today's visit: -Stop Prozac, start Trintellix 5 mg daily - let me know if this is expensive or not covered -Use nasal saline and then Flonase 2 sprays on each side twice a day for ear popping -New antibiotic sent, Cefdinir 300 mg twice daily for 5 days   Follow up in: 6 weeks   Take care and let us know if you have any questions or concerns prior to your next visit.  Dr. Caralee Ates

## 2021-08-17 ENCOUNTER — Telehealth: Payer: Self-pay | Admitting: Internal Medicine

## 2021-08-17 DIAGNOSIS — F330 Major depressive disorder, recurrent, mild: Secondary | ICD-10-CM

## 2021-08-17 NOTE — Telephone Encounter (Signed)
Patient called in stating the medication for anxiety the provider prescribed cost too much and that the provider did discuss another alternative at appointment (patient is unsure of the name of medication suggested) Please assist patient further.

## 2021-08-17 NOTE — Telephone Encounter (Signed)
Pt made an additional call regarding anxiety medication sent in (pt did not know the name of the medication) that is too expensive, please advise.

## 2021-08-21 MED ORDER — BUPROPION HCL ER (XL) 150 MG PO TB24: 150.0000 mg | ORAL_TABLET | Freq: Every day | ORAL | 0 refills | Status: DC

## 2021-09-04 ENCOUNTER — Ambulatory Visit: Payer: Self-pay

## 2021-09-04 NOTE — Telephone Encounter (Signed)
Chief Complaint: Left ear pain Symptoms: Tenderness to outside, pain at a 6-7, been on 3 different antibiotics with no relief Frequency: Pain onset 07/08/21 Pertinent Negatives: Patient denies drainage or other symptoms Disposition: [] ED /[] Urgent Care (no appt availability in office) / [x] Appointment(In office/virtual)/ []  [facility]/ [] Home Care/ [] Refused Recommended Disposition /[] [facility]/ []  Follow-up with PCP Additional Notes: Unable to come tomorrow to office, took next available on Friday.     Summary: lt ear pain w/ swelling   Patient states antibiotics prescribed by provider for ear infection did not work, patient states she has been prescribed 3 different antibiotics in the past 3/4 mos and nothing has cleared up the infection   Patient states her lt ear is still swollen w/ pain   Patient inquiring if another antibiotic can be prescribed   Please assist further      Reason for Disposition  Earache  (Exceptions: brief ear pain of < 60 minutes duration, earache occurring during air travel  Answer Assessment - Initial Assessment Questions 1. LOCATION: "Which ear is involved?"     Left ear 2. ONSET: "When did the ear start hurting"      Constant since 07/08/21 3. SEVERITY: "How bad is the pain?"  (Scale 1-10; mild, moderate or severe)   - MILD (1-3): doesn't interfere with normal activities    - MODERATE (4-7): interferes with normal activities or awakens from sleep    - SEVERE (8-10): excruciating pain, unable to do any normal activities      6-7 4. URI SYMPTOMS: "Do you have a runny nose or cough?"     No 5. FEVER: "Do you have a fever?" If Yes, ask: "What is your temperature, how was it measured, and when did it start?"     No 6. CAUSE: "Have you been swimming recently?", "How often do you use Q-TIPS?", "Have you had any recent air travel or scuba diving?"     No 7. OTHER SYMPTOMS: "Do you have any other symptoms?" (e.g., headache, stiff  neck, dizziness, vomiting, runny nose, decreased hearing)     Headache periodically 8. PREGNANCY: "Is there any chance you are pregnant?" "When was your last menstrual period?"     No  Protocols used: 

## 2021-09-04 NOTE — Telephone Encounter (Signed)
Summary: lt ear pain w/ swelling   Patient states antibiotics prescribed by provider for ear infection did not work, patient states she has been prescribed 3 different antibiotics in the past 3/4 mos and nothing has cleared up the infection   Patient states her lt ear is still swollen w/ pain   Patient inquiring if another antibiotic can be prescribed   Please assist further     Called pt - LMOM

## 2021-09-06 NOTE — Progress Notes (Signed)
Acute Office Visit  Subjective:     Patient ID: Yvonne Little, female    DOB: May 10, 1995, 26 y.o.   MRN: [id number]  Chief Complaint  Patient presents with   ear pain    HPI Patient is in today for ear pain. Was seen for this same issue last month, previously had been seen at 2 different Urgent Cares.   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 No history of tubes.  Status: worse Treatments attempted:  Amoxcillin, Augmentin with ear drops, Cefdninr x 5 days - helped intially but then pain came back after finishing antibiotic   MDD: -Mood status: stable -Current treatment: Wellbutrin 150 mg XR - been on this medication for 2 weeks  -Satisfied with current treatment?:  feels like ADHD is slightly better and anxiety somewhat  -Duration of current treatment :  2 weeks -Side effects: had a hard time sleeping last night not sure  Medication compliance: excellent compliance Psychotherapy/counseling: no  Previous psychiatric medications: cymbalta, lexapro, and prozac     09/08/2021    9:32 AM 08/10/2021    2:29 PM 06/29/2021    3:01 PM 03/31/2021   12:57 PM 10/12/2020    2:47 PM  Depression screen PHQ 2/9  Decreased Interest 1 2 2  0 0  Down, Depressed, Hopeless 1 1 2  0 0  PHQ - 2 Score 2 3 4  0 0  Altered sleeping 0 3 3 0 0  Tired, decreased energy 0 2 2 0 0  Change in appetite 0 3 1 0 0  Feeling bad or failure about yourself  0 0 1 0 0  Trouble concentrating 0 1 3 0 0  Moving slowly or fidgety/restless 0 1 1 0 0  Suicidal thoughts 0 0 0 0 0  PHQ-9 Score 2 13 15  0 0  Difficult doing work/chores Not  difficult at all Very difficult Very difficult Not difficult at all Not difficult at all    Review of Systems  Constitutional:  Negative for chills and fever.  HENT:  Positive for ear pain. Negative for congestion, ear discharge, sinus pain, sore throat and tinnitus.   Respiratory:  Negative for cough.   Cardiovascular:  Negative for chest pain.  Neurological:  Positive for headaches.        Objective:    BP 112/72   Pulse 100   Temp 98.1 F (36.7 C)   Resp 16   Ht 5\' 8"  (1.727 m)   Wt 216 lb 1.6 oz (98 kg)   SpO2 98%   BMI 32.86 kg/m  BP Readings from Last 3 Encounters:  09/08/21 112/72  08/10/21 124/82  07/08/21 110/75   Wt Readings from Last 3 Encounters:  09/08/21 216 lb 1.6 oz (98 kg)  08/10/21 213 lb 9.6 oz (96.9 kg)  06/29/21 212 lb (96.2 kg)      Physical Exam Constitutional:      Appearance: Normal appearance.  HENT:     Head: Normocephalic and atraumatic.     Right Ear: Ear canal and external ear normal.     Left Ear: Ear  canal and external ear normal.     Ears:     Comments: Eustachian tube dysfunction bilaterally - unidentifiable blue object on the left TM but no purulence or fluid  Eyes:     Conjunctiva/sclera: Conjunctivae normal.  Cardiovascular:     Rate and Rhythm: Normal rate and regular rhythm.  Pulmonary:     Effort: Pulmonary effort is normal.     Breath sounds: Normal breath sounds.  Musculoskeletal:     Right lower leg: No edema.     Left lower leg: No edema.  Skin:    General: Skin is warm and dry.  Neurological:     General: No focal deficit present.     Mental Status: She is alert. Mental status is at baseline.  Psychiatric:        Mood and Affect: Mood normal.        Behavior: Behavior normal.     No results found for any visits on 09/08/21.      Assessment & Plan:   1. Ear pain, left/Eustachian tube dysfunction, bilateral: No infection present but uncertain about blue object on TM causing the pain pain. No  antibiotics indicated at this time, does have signs of bilateral eustachian tube dysfunction that Flonase did not help. Will treat with Medrol dose pack and was given sample of Ryaltris. Referral to ENT for more thorough ear exam.   - methylPREDNISolone (MEDROL DOSEPAK) 4 MG TBPK tablet; Day 1: Take 8 mg (2 tablets) before breakfast, 4 mg (1 tablet) after lunch, 4 mg (1 tablet) after supper, and 8 mg (2 tablets) at bedtime. Day 2:Take 4 mg (1 tablet) before breakfast, 4 mg (1 tablet) after lunch, 4 mg (1 tablet) after supper, and 8 mg (2 tablets) at bedtime. Day 3: Take 4 mg (1 tablet) before breakfast, 4 mg (1 tablet) after lunch, 4 mg (1 tablet) after supper, and 4 mg (1 tablet) at bedtime. Day 4: Take 4 mg (1 tablet) before breakfast, 4 mg (1 tablet) after lunch, and 4 mg (1 tablet) at bedtime. Day 5: Take 4 mg (1 tablet) before breakfast and 4 mg (1 tablet) at bedtime. Day 6: Take 4 mg (1 tablet) before breakfast.  Dispense: 1 each; Refill: 0 - Ambulatory referral to ENT  2. Mild episode of recurrent major depressive disorder (HCC): Been on Wellbutrin XR 150 mg now for 2 weeks - helping ADHD and anxiety symptoms but worried its impacting her sleep. Will continue at this dose and follow up in 1 month.   Return in about 4 weeks (around 10/06/2021).  Margarita Mail, DO

## 2021-09-08 ENCOUNTER — Encounter: Payer: Self-pay | Admitting: Internal Medicine

## 2021-09-08 ENCOUNTER — Ambulatory Visit (INDEPENDENT_AMBULATORY_CARE_PROVIDER_SITE_OTHER): Payer: 59 | Admitting: Internal Medicine

## 2021-09-08 VITALS — BP 112/72 | HR 100 | Temp 98.1°F | Resp 16 | Ht 68.0 in | Wt 216.1 lb

## 2021-09-08 DIAGNOSIS — H6983 Other specified disorders of Eustachian tube, bilateral: Secondary | ICD-10-CM

## 2021-09-08 DIAGNOSIS — H9202 Otalgia, left ear: Secondary | ICD-10-CM

## 2021-09-08 DIAGNOSIS — F330 Major depressive disorder, recurrent, mild: Secondary | ICD-10-CM

## 2021-09-08 MED ORDER — METHYLPREDNISOLONE 4 MG PO TBPK: ORAL_TABLET | ORAL | 0 refills | Status: DC

## 2021-09-08 NOTE — Patient Instructions (Addendum)
It was great seeing you today!  Plan discussed at today's visit: -For ear - Medrol dose pack  -Try nasal spray 2 sprays on each side twice a day -Referral to ENT -Keep Wellbutrin the same, will follow up in 1 month   Follow up in: 1 month   Take care and let us know if you have any questions or concerns prior to your next visit.  Dr. Caralee Ates

## 2021-09-21 ENCOUNTER — Encounter: Payer: Self-pay | Admitting: Internal Medicine

## 2021-09-21 ENCOUNTER — Ambulatory Visit: Payer: 59 | Admitting: Internal Medicine

## 2021-10-10 ENCOUNTER — Ambulatory Visit: Payer: 59 | Admitting: Internal Medicine

## 2021-10-10 NOTE — Progress Notes (Deleted)
Acute Office Visit  Subjective:     Patient ID: Yvonne Little, female    DOB: 1995-04-08, 26 y.o.   MRN: [id number]  No chief complaint on file.   HPI Patient is in today for ear pain. Was seen for this same issue last month, previously had been seen at 2 different Urgent Cares.   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 No history of tubes.  Status: worse Treatments attempted:  Amoxcillin, Augmentin with ear drops, Cefdninr x 5 days - helped intially but then pain came back after finishing antibiotic   MDD: -Mood status: stable -Current treatment: Wellbutrin 150 mg XR - been on this medication for 2 weeks  -Satisfied with current treatment?:  feels like ADHD is slightly better and anxiety somewhat  -Duration of current treatment :  2 weeks -Side effects: had a hard time sleeping last night not sure  Medication compliance: excellent compliance Psychotherapy/counseling: no  Previous psychiatric medications: cymbalta, lexapro, and prozac     09/08/2021    9:32 AM 08/10/2021    2:29 PM 06/29/2021    3:01 PM 03/31/2021   12:57 PM 10/12/2020    2:47 PM  Depression screen PHQ 2/9  Decreased Interest 1 2 2  0 0  Down, Depressed, Hopeless 1 1 2  0 0  PHQ - 2 Score 2 3 4  0 0  Altered sleeping 0 3 3 0 0  Tired, decreased energy 0 2 2 0 0  Change in appetite 0 3 1 0 0  Feeling bad or failure about yourself  0 0 1 0 0  Trouble concentrating 0 1 3 0 0  Moving slowly or fidgety/restless 0 1 1 0 0  Suicidal thoughts 0 0 0 0 0  PHQ-9 Score 2 13 15  0 0  Difficult doing work/chores Not difficult at all Very difficult  Very difficult Not difficult at all Not difficult at all    Review of Systems  Constitutional:  Negative for chills and fever.  HENT:  Positive for ear pain. Negative for congestion, ear discharge, sinus pain, sore throat and tinnitus.   Respiratory:  Negative for cough.   Cardiovascular:  Negative for chest pain.  Neurological:  Positive for headaches.        Objective:    There were no vitals taken for this visit. BP Readings from Last 3 Encounters:  09/08/21 112/72  08/10/21 124/82  07/08/21 110/75   Wt Readings from Last 3 Encounters:  09/08/21 216 lb 1.6 oz (98 kg)  08/10/21 213 lb 9.6 oz (96.9 kg)  06/29/21 212 lb (96.2 kg)      Physical Exam Constitutional:      Appearance: Normal appearance.  HENT:     Head: Normocephalic and atraumatic.     Right Ear: Ear canal and external ear normal.     Left Ear: Ear canal and external ear normal.     Ears:     Comments: Eustachian tube dysfunction bilaterally - unidentifiable blue object on the left TM but no purulence or fluid  Eyes:     Conjunctiva/sclera: Conjunctivae normal.  Cardiovascular:     Rate and Rhythm: Normal rate and regular rhythm.  Pulmonary:     Effort: Pulmonary effort is normal.     Breath sounds: Normal breath sounds.  Musculoskeletal:     Right lower leg: No edema.     Left lower leg: No edema.  Skin:    General: Skin is warm and dry.  Neurological:     General: No focal deficit present.     Mental Status: She is alert. Mental status is at baseline.  Psychiatric:        Mood and Affect: Mood normal.        Behavior: Behavior normal.     No results found for any visits on 10/10/21.      Assessment & Plan:   1. Ear pain, left/Eustachian tube dysfunction, bilateral: No infection present but uncertain about blue object on TM causing the pain pain. No antibiotics indicated at this time, does have signs of bilateral eustachian tube dysfunction that Flonase did not help. Will treat with  Medrol dose pack and was given sample of Ryaltris. Referral to ENT for more thorough ear exam.   - methylPREDNISolone (MEDROL DOSEPAK) 4 MG TBPK tablet; Day 1: Take 8 mg (2 tablets) before breakfast, 4 mg (1 tablet) after lunch, 4 mg (1 tablet) after supper, and 8 mg (2 tablets) at bedtime. Day 2:Take 4 mg (1 tablet) before breakfast, 4 mg (1 tablet) after lunch, 4 mg (1 tablet) after supper, and 8 mg (2 tablets) at bedtime. Day 3: Take 4 mg (1 tablet) before breakfast, 4 mg (1 tablet) after lunch, 4 mg (1 tablet) after supper, and 4 mg (1 tablet) at bedtime. Day 4: Take 4 mg (1 tablet) before breakfast, 4 mg (1 tablet) after lunch, and 4 mg (1 tablet) at bedtime. Day 5: Take 4 mg (1 tablet) before breakfast and 4 mg (1 tablet) at bedtime. Day 6: Take 4 mg (1 tablet) before breakfast.  Dispense: 1 each; Refill: 0 - Ambulatory referral to ENT  2. Mild episode of recurrent major depressive disorder (HCC): Been on Wellbutrin XR 150 mg now for 2 weeks - helping ADHD and anxiety symptoms but worried its impacting her sleep. Will continue at this dose and follow up in 1 month.   No follow-ups on file.  Margarita Mail, DO

## 2021-10-13 ENCOUNTER — Ambulatory Visit (INDEPENDENT_AMBULATORY_CARE_PROVIDER_SITE_OTHER): Payer: 59 | Admitting: Internal Medicine

## 2021-10-13 ENCOUNTER — Encounter: Payer: Self-pay | Admitting: Internal Medicine

## 2021-10-13 VITALS — BP 128/70 | HR 93 | Resp 16 | Ht 69.0 in | Wt 214.0 lb

## 2021-10-13 DIAGNOSIS — F330 Major depressive disorder, recurrent, mild: Secondary | ICD-10-CM

## 2021-10-13 DIAGNOSIS — R41840 Attention and concentration deficit: Secondary | ICD-10-CM

## 2021-10-13 DIAGNOSIS — H6983 Other specified disorders of Eustachian tube, bilateral: Secondary | ICD-10-CM

## 2021-10-13 MED ORDER — SERTRALINE HCL 25 MG PO TABS: 25.0000 mg | ORAL_TABLET | Freq: Every day | ORAL | 1 refills | Status: DC

## 2021-10-13 NOTE — Patient Instructions (Addendum)
It was great seeing you today!  Plan discussed at today's visit: -Start Zoloft 25 mg  -Referral placed to [facility] at [address], [place], [place] [address], [phone] -ENT appointment next week   Follow up in: 6 weeks   Take care and let us know if you have any questions or concerns prior to your next visit.  Dr. Caralee Ates

## 2021-10-13 NOTE — Progress Notes (Signed)
Established Office Visit  Subjective:     Patient ID: Yvonne Little, female    DOB: 1995-07-01, 26 y.o.   MRN: [id number]  Chief Complaint  Patient presents with   Follow-up    HPI Patient is in today for follow up. Had been seen for bilateral ear pain previously. Was treated with Amoxicillin, Augmentin and Cefdinir previously as well as ear drops and steroids. Pain improved but still feeling pressure in bilateral ears. Tried Flonase and oral steroids without relief. Seeing ENT next week.   MDD: -Mood status: stable -Current treatment: Wellbutrin 150 mg XR  -Satisfied with current treatment?: No, caused severe constipation, discontinued a few weeks ago Psychotherapy/counseling: no  Previous psychiatric medications: cymbalta, lexapro, and prozac, Paxil  Referral for ADHD treatment pending as well      10/13/2021    3:17 PM 09/08/2021    9:32 AM 08/10/2021    2:29 PM 06/29/2021    3:01 PM 03/31/2021   12:57 PM  Depression screen PHQ 2/9  Decreased Interest 1 1 2 2  0  Down, Depressed, Hopeless 1 1 1 2  0  PHQ - 2 Score 2 2 3 4  0  Altered sleeping 3 0 3 3 0  Tired, decreased energy 3 0 2 2 0  Change in appetite 0 0 3 1 0  Feeling bad or failure about yourself  0 0 0 1 0  Trouble concentrating 3 0 1 3 0  Moving slowly or fidgety/restless 0 0 1 1 0  Suicidal thoughts 0 0 0 0 0  PHQ-9 Score 11 2 13 15  0  Difficult doing work/chores  Not difficult at all Very difficult Very difficult Not difficult at all    Review of Systems  Constitutional:  Negative for chills and fever.  HENT:  Positive for ear pain. Negative for congestion, ear discharge, sinus pain, sore throat and tinnitus.   Respiratory:  Negative for cough.   Cardiovascular:  Negative for chest pain.  Neurological:  Positive for headaches.  Psychiatric/Behavioral:  Positive for depression.         Objective:    BP 128/70   Pulse 93   Resp 16   Ht 5\' 9"  (1.753 m)   Wt 214 lb (97.1 kg)   SpO2 96%   BMI 31.60  kg/m  BP Readings from Last 3 Encounters:  10/13/21 128/70  09/08/21 112/72  08/10/21 124/82   Wt Readings from Last 3 Encounters:  10/13/21 214 lb (97.1 kg)  09/08/21 216 lb 1.6 oz (98 kg)  08/10/21 213 lb 9.6 oz (96.9 kg)      Physical Exam Constitutional:      Appearance: Normal appearance.  HENT:     Head: Normocephalic and atraumatic.     Right Ear: Ear canal and external ear normal.     Left Ear: Ear canal and external ear normal.     Ears:     Comments: Eustachian tube dysfunction bilaterally  Eyes:     Conjunctiva/sclera: Conjunctivae normal.  Cardiovascular:     Rate and Rhythm: Normal rate and regular rhythm.  Pulmonary:     Effort: Pulmonary effort is normal.     Breath sounds: Normal breath sounds.  Musculoskeletal:     Right lower leg: No edema.     Left lower leg: No edema.  Skin:    General: Skin is warm and dry.  Neurological:     General: No focal deficit present.     Mental Status: She is alert.  Mental status is at baseline.  Psychiatric:        Mood and Affect: Mood normal.        Behavior: Behavior normal.     No results found for any visits on 10/13/21.      Assessment & Plan:   1. Mild episode of recurrent major depressive disorder (HCC)/Attention and concentration deficit: Stop Wellbutrin, start Zoloft 25 mg. Referral to [facility] placed in [place] for ADHD management. Follow up in 6 weeks.   - sertraline (ZOLOFT) 25 MG tablet; Take 1 tablet (25 mg total) by mouth daily.  Dispense: 30 tablet; Refill: 1  2. Eustachian tube dysfunction, bilateral: Still present on exam, did not improve with Medrol dosepack or Flonase. Offered patient Prednisone taper but planning on seeing ENT next week.   Return in about 6 weeks (around 11/24/2021).  Margarita Mail, DO

## 2021-10-23 ENCOUNTER — Ambulatory Visit (HOSPITAL_COMMUNITY): Payer: 59 | Admitting: Student in an Organized Health Care Education/Training Program

## 2021-10-31 ENCOUNTER — Encounter: Payer: Self-pay | Admitting: Internal Medicine

## 2021-11-01 ENCOUNTER — Other Ambulatory Visit: Payer: Self-pay | Admitting: Internal Medicine

## 2021-11-02 ENCOUNTER — Other Ambulatory Visit: Payer: Self-pay | Admitting: Internal Medicine

## 2021-11-02 DIAGNOSIS — F419 Anxiety disorder, unspecified: Secondary | ICD-10-CM

## 2021-11-02 DIAGNOSIS — F329 Major depressive disorder, single episode, unspecified: Secondary | ICD-10-CM

## 2021-11-02 MED ORDER — BUSPIRONE HCL 10 MG PO TABS: 10.0000 mg | ORAL_TABLET | Freq: Three times a day (TID) | ORAL | 3 refills | Status: DC

## 2021-11-07 ENCOUNTER — Other Ambulatory Visit: Payer: Self-pay | Admitting: Internal Medicine

## 2021-11-07 DIAGNOSIS — F330 Major depressive disorder, recurrent, mild: Secondary | ICD-10-CM

## 2021-11-08 NOTE — Telephone Encounter (Signed)
Pt ins wanting 90 days

## 2021-11-08 NOTE — Telephone Encounter (Signed)
Requested medication (s) are due for refill today: No  Requested medication (s) are on the active medication list: Yes  Last refill:  10/13/21  Future visit scheduled: Yes  Notes to clinic:  Pharmacy requests 90 day supply and diagnosis code.    Requested Prescriptions  Pending Prescriptions Disp Refills   sertraline (ZOLOFT) 25 MG tablet [Pharmacy Med Name: SERTRALINE HCL 25 MG TABLET] 90 tablet 1    Sig: TAKE 1 TABLET (25 MG TOTAL) BY MOUTH DAILY.     Psychiatry:  Antidepressants - SSRI - sertraline Failed - 11/07/2021 12:31 PM      Failed - AST in normal range and within 360 days    AST  Date Value Ref Range Status  10/11/2020 45 (H) 15 - 41 U/L Final         Failed - ALT in normal range and within 360 days    ALT  Date Value Ref Range Status  10/11/2020 75 (H) 0 - 44 U/L Final         Passed - Completed PHQ-2 or PHQ-9 in the last 360 days      Passed - Valid encounter within last 6 months    Recent Outpatient Visits           3 weeks ago Mild episode of recurrent major depressive disorder [facility])   [facility] Margarita Mail, DO   2 months ago Ear pain, left   [facility] Margarita Mail, DO   3 months ago Mild episode of recurrent major depressive disorder [facility])   [facility] Margarita Mail, DO   4 months ago Mild episode of recurrent major depressive disorder [facility])   [facility] Margarita Mail, DO   7 months ago Axillary lump, left   [facility] Margarita Mail, DO       Future Appointments             In 1 week Margarita Mail, DO [facility], [facility]

## 2021-11-15 ENCOUNTER — Emergency Department (HOSPITAL_BASED_OUTPATIENT_CLINIC_OR_DEPARTMENT_OTHER)
Admission: EM | Admit: 2021-11-15 | Discharge: 2021-11-16 | Disposition: A | Discharge status: Home / Self Care | Payer: 59 | Attending: Emergency Medicine | Admitting: Emergency Medicine

## 2021-11-15 ENCOUNTER — Encounter (HOSPITAL_BASED_OUTPATIENT_CLINIC_OR_DEPARTMENT_OTHER): Payer: Self-pay | Admitting: Emergency Medicine

## 2021-11-15 ENCOUNTER — Other Ambulatory Visit: Payer: Self-pay

## 2021-11-15 DIAGNOSIS — E039 Hypothyroidism, unspecified: Secondary | ICD-10-CM | POA: Insufficient documentation

## 2021-11-15 DIAGNOSIS — R102 Pelvic and perineal pain: Secondary | ICD-10-CM | POA: Insufficient documentation

## 2021-11-15 DIAGNOSIS — N83209 Unspecified ovarian cyst, unspecified side: Secondary | ICD-10-CM

## 2021-11-15 DIAGNOSIS — N3000 Acute cystitis without hematuria: Secondary | ICD-10-CM

## 2021-11-15 NOTE — ED Triage Notes (Signed)
Pt states that she thinks that her IUD is coming out. She states that she was squatted down and when she stood up she felt a sharp pain. She states that she can feel something sharp at the opening of her cervix and has some bleeding.

## 2021-11-16 ENCOUNTER — Emergency Department (HOSPITAL_BASED_OUTPATIENT_CLINIC_OR_DEPARTMENT_OTHER): Payer: 59

## 2021-11-16 LAB — PREGNANCY, URINE: Preg Test, Ur: NEGATIVE

## 2021-11-16 LAB — URINALYSIS, ROUTINE W REFLEX MICROSCOPIC
Bilirubin Urine: NEGATIVE
Glucose, UA: NEGATIVE mg/dL
Ketones, ur: NEGATIVE mg/dL
Leukocytes,Ua: NEGATIVE
Nitrite: POSITIVE — AB
Specific Gravity, Urine: 1.027 (ref 1.005–1.030)
pH: 6 (ref 5.0–8.0)

## 2021-11-16 MED ORDER — CEPHALEXIN 500 MG PO CAPS: 500.0000 mg | ORAL_CAPSULE | Freq: Three times a day (TID) | ORAL | 0 refills | Status: DC

## 2021-11-16 MED ORDER — CEPHALEXIN 250 MG PO CAPS
500.0000 mg | ORAL_CAPSULE | Freq: Once | ORAL | Status: AC
  Administered 2021-11-16: 500 mg via ORAL
  Filled 2021-11-16: qty 2

## 2021-11-16 MED ORDER — OXYCODONE-ACETAMINOPHEN 5-325 MG PO TABS
2.0000 | ORAL_TABLET | Freq: Once | ORAL | Status: AC
  Administered 2021-11-16: 2 via ORAL
  Filled 2021-11-16: qty 2

## 2021-11-16 MED ORDER — HYDROCODONE-ACETAMINOPHEN 5-325 MG PO TABS: 1.0000 | ORAL_TABLET | Freq: Four times a day (QID) | ORAL | 0 refills | Status: DC | PRN

## 2021-11-16 NOTE — ED Provider Notes (Signed)
[facility] EMERGENCY DEPT Provider Note   CSN: [id number] Arrival date & time: 11/15/21  2026     History  Chief Complaint  Patient presents with   Vaginal Pain    Jaeley Wiker is a 26 y.o. female.  Patient is a 26 year old female with history of hypothyroidism, anxiety presenting with complaints of vaginal pain.  She reports working in the yard this evening.  When she bent over to pick something up, she felt a poking sensation in her vagina followed by a small amount of bleeding.  She is concerned that her IUD may have become dislodged.  She had her husband feel and he felt as though there is a sharp object there.  Patient has erratic menstrual periods and does not recall her last period.  IUD has been in place for approximately 1 year and has had no complications thus far.  The history is provided by the patient.       Home Medications Prior to Admission medications   Medication Sig Start Date End Date Taking? Authorizing Provider  ARMOUR THYROID 90 MG tablet Take 90 mg by mouth daily. 03/26/20   [provider]  busPIRone (BUSPAR) 10 MG tablet Take 1 tablet (10 mg total) by mouth 3 (three) times daily. 11/02/21   Teodora Medici, DO  clonazePAM (KLONOPIN) 0.5 MG tablet Take 0.25 mg by mouth daily as needed for anxiety. 08/02/20   [provider]  ondansetron (ZOFRAN-ODT) 4 MG disintegrating tablet Take 1 tablet (4 mg total) by mouth every 8 (eight) hours as needed for nausea or vomiting. 09/02/20   Davonna Belling, MD  pantoprazole (PROTONIX) 40 MG tablet Take 1 tablet (40 mg total) by mouth daily at 12 noon. 08/29/20   Ghimire, Henreitta Leber, MD  promethazine (PHENERGAN) 12.5 MG tablet Take 1 tablet (12.5 mg total) by mouth every 8 (eight) hours as needed for nausea or vomiting. 10/12/20   Myles Gip, DO  promethazine (PHENERGAN) 25 MG tablet Take 1 tablet (25 mg total) by mouth every 6 (six) hours as needed for nausea or vomiting. May cause  significant drowsiness 07/08/21   Volney American, PA-C  sertraline (ZOLOFT) 25 MG tablet Take 1 tablet (25 mg total) by mouth daily. 10/13/21   Teodora Medici, DO      Allergies    Escitalopram, Cymbalta [duloxetine hcl], Levothyroxine, and Lidocaine    Review of Systems   Review of Systems  All other systems reviewed and are negative.   Physical Exam Updated Vital Signs BP 121/80 (BP Location: Right Arm)   Pulse 85   Temp (!) 97 F (36.1 C)   Resp 18   Ht 5\' 9"  (1.753 m)   Wt 97.1 kg   SpO2 100%   BMI 31.60 kg/m  Physical Exam Vitals and nursing note reviewed.  Constitutional:      General: She is not in acute distress.    Appearance: Normal appearance. She is not ill-appearing.  HENT:     Head: Normocephalic and atraumatic.  Pulmonary:     Effort: Pulmonary effort is normal.  Abdominal:     General: Abdomen is flat. There is no distension.     Tenderness: There is no abdominal tenderness.  Skin:    General: Skin is warm and dry.  Neurological:     Mental Status: She is alert.     ED Results / Procedures / Treatments   Labs (all labs ordered are listed, but only abnormal results are displayed) Labs Reviewed  URINALYSIS, ROUTINE W REFLEX MICROSCOPIC  PREGNANCY, URINE    EKG None  Radiology No results found.  Procedures Procedures    Medications Ordered in ED Medications - No data to display  ED Course/ Medical Decision Making/ A&P  Patient presenting with complaints of vaginal pain and possible dislodged IUD.  She reports lifting items in the yard this evening when she felt the sudden onset as if something was poking her in her vagina.  I am unable to identify any foreign body on pelvic exam.  Her uterus is somewhat retroflexed and the os is difficult to visualize.  On bimanual exam, I feel no obvious abnormalities or foreign body.  I did obtain a CT scan to rule out other pathology.  There is a 4 cm ovarian cyst, but no other issues.  The  IUD appears to be in appropriate position per CT.  Urinalysis does suggest a UTI.  Patient will be treated with antibiotics, medication for pain, and is to follow-up with GYN.  Final Clinical Impression(s) / ED Diagnoses Final diagnoses:  None    Rx / DC Orders ED Discharge Orders     None         Veryl Speak, MD 11/16/21 [phone]

## 2021-11-16 NOTE — ED Notes (Signed)
Reviewed AVS/discharge instruction with patient. Time allotted for and all questions answered. Patient is agreeable for d/c and escorted to ed exit by staff.  

## 2021-11-16 NOTE — Discharge Instructions (Signed)
Begin taking Keflex as prescribed.  Begin taking hydrocodone as prescribed as needed for pain.  Follow-up with GYN in the next few days for recheck, and return to the ER if symptoms significantly worsen or change.

## 2021-11-16 NOTE — ED Notes (Signed)
Patient transported to CT 

## 2021-11-17 ENCOUNTER — Ambulatory Visit: Payer: 59 | Admitting: Internal Medicine

## 2021-12-04 ENCOUNTER — Other Ambulatory Visit: Payer: Self-pay | Admitting: Internal Medicine

## 2021-12-04 DIAGNOSIS — F419 Anxiety disorder, unspecified: Secondary | ICD-10-CM

## 2021-12-05 NOTE — Telephone Encounter (Signed)
Requested medication (s) are due for refill today: yes for 90 day review  Requested medication (s) are on the active medication list: yes  Last refill:  11/02/21  Future visit scheduled:yes  Notes to clinic:  Unable to refill per protocol, last refill by provider 11/02/21. comment: REQUEST FOR 90 DAYS PRESCRIPTION. DX Code Needed.     Requested Prescriptions  Pending Prescriptions Disp Refills   busPIRone (BUSPAR) 10 MG tablet [Pharmacy Med Name: BUSPIRONE HCL 10 MG TABLET] 270 tablet 2    Sig: TAKE 1 TABLET BY MOUTH THREE TIMES A DAY     Psychiatry: Anxiolytics/Hypnotics - Non-controlled Passed - 12/04/2021  2:30 PM      Passed - Valid encounter within last 12 months    Recent Outpatient Visits           1 month ago Mild episode of recurrent major depressive disorder [facility])   [place], DO   2 months ago Ear pain, left   [facility] Teodora Medici, DO   3 months ago Mild episode of recurrent major depressive disorder [facility])   [place], DO   5 months ago Mild episode of recurrent major depressive disorder [facility])   [place], DO   8 months ago Axillary lump, left   [facility] Teodora Medici, [place]

## 2021-12-05 NOTE — Telephone Encounter (Signed)
Ins needing 90 days

## 2022-01-31 ENCOUNTER — Telehealth: Payer: Self-pay | Admitting: Internal Medicine

## 2022-01-31 ENCOUNTER — Other Ambulatory Visit: Payer: Self-pay | Admitting: Internal Medicine

## 2022-01-31 DIAGNOSIS — E038 Other specified hypothyroidism: Secondary | ICD-10-CM

## 2022-01-31 NOTE — Telephone Encounter (Signed)
Referral Request - Has patient seen PCP for this complaint? Yes.   *If NO, is insurance requiring patient see PCP for this issue before PCP can refer them? Referral for which specialty: endocrinology Preferred provider/office: [facility] [address]  [address] [place], [place]  [address]        [phone] Dr Warden Fillers Reason for referral: thyroidism  Pt states she finally found an endocrinologist she wants to go to.  She was to let DR Caralee Ates know.

## 2022-04-10 ENCOUNTER — Encounter: Payer: Self-pay | Admitting: Internal Medicine

## 2022-04-30 NOTE — Progress Notes (Deleted)
Established Patient Office Visit  Subjective   Patient ID: Yvonne Little, female    DOB: 19-Jun-1995  Age: 27 y.o. MRN: [id number]  No chief complaint on file.   HPI  Patient is here for follow up on chronic medical conditions.   Left Ear Pain: -Recently diagnosed with otitis media and otitis externa in the left ear.  She was initially started on amoxicillin, however this was not enough so she was started on Augmentin.  She finished this about 1-1/2 weeks ago.  She is also given an otic steroid and antibiotic combination drop. -Today she states that she still having left ear pain.  She denies fevers, drainage from the ear, hearing changes or changes in tinnitus. -She does endorse pressure-like sensation in both ears.  She states this is chronic for her but does get worse when she flies on airplanes.  She is worried because she has several trips planned this summer and she would like something to help her with the pressure changes in her ears.  Subclinical Hypothyroidism: -TSH in 11/22 4.447 -Had been following with Endocrinology at [facility] but this provider left, has appointment with Endocrinology in [place] coming up -Currently on Armour Thyroid 90 mg daily, which controls symptoms    GERD: -Currently on Protonix 40 mg as needed, started while on Paxil. -Symptoms include reflux, occasional epigastric pain.  Denies nausea, vomiting.  Appetite good.  Weight stable.  MDD: -Mood status: uncontrolled -Current treatment: Started on Prozac 10 at [place] but does not like it, states it is making her feel more apathetic and tired. -Satisfied with current treatment?: no -Symptom severity: moderate  Previous psychiatric medications: Cymbalta caused severe anger and Paxil caused severe heartburn symptoms and GI pain. Lexapro had severe weakness after only 1 pill.  Depressed mood: yes Anxious mood: yes Anhedonia:  Occasionally Fatigue: yes     10/13/2021    3:17 PM 09/08/2021    9:32 AM  08/10/2021    2:29 PM 06/29/2021    3:01 PM 03/31/2021   12:57 PM  Depression screen PHQ 2/9  Decreased Interest '1 1 2 2 '$ 0  Down, Depressed, Hopeless '1 1 1 2 '$ 0  PHQ - 2 Score '2 2 3 4 '$ 0  Altered sleeping 3 0 3 3 0  Tired, decreased energy 3 0 2 2 0  Change in appetite 0 0 3 1 0  Feeling bad or failure about yourself  0 0 0 1 0  Trouble concentrating 3 0 1 3 0  Moving slowly or fidgety/restless 0 0 1 1 0  Suicidal thoughts 0 0 0 0 0  PHQ-9 Score '11 2 13 15 '$ 0  Difficult doing work/chores  Not difficult at all Very difficult Very difficult Not difficult at all   Health Maintenance: -Blood work due -Pap due. Following with gynecology   Past Medical History:  Diagnosis Date   Anxiety    Articular disc disorder of temporomandibular joint, 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   Depression    Factor 5 Leiden mutation, heterozygous ([place])    Thyroid disease    hx of hypo   No past surgical history on file. Social History   Tobacco Use   Smoking status: Never   Smokeless tobacco: Never  Vaping Use   Vaping Use: Every day   Substances: Nicotine  Substance Use Topics   Alcohol use: Not Currently   Drug use: Never   Allergies  Allergen Reactions   Escitalopram Other (See Comments)    Altered mental status.  Cymbalta [Duloxetine Hcl] Other (See Comments)    Severe mood changes   Levothyroxine Nausea Only   Lidocaine Hives and Rash      Review of Systems  Constitutional:  Negative for chills and fever.  HENT:  Positive for ear pain. Negative for congestion, ear discharge, hearing loss and tinnitus.   Respiratory:  Negative for cough.   Cardiovascular:  Negative for chest pain.  Gastrointestinal:  Positive for heartburn. Negative for abdominal pain, nausea and vomiting.  Psychiatric/Behavioral:  Positive for depression. The patient is nervous/anxious.       Objective:     There were no vitals taken for this visit. BP Readings from Last 3 Encounters:  11/15/21 121/80   10/13/21 128/70  09/08/21 112/72   Wt Readings from Last 3 Encounters:  11/15/21 214 lb (97.1 kg)  10/13/21 214 lb (97.1 kg)  09/08/21 216 lb 1.6 oz (98 kg)      Physical Exam Constitutional:      Appearance: Normal appearance.  HENT:     Head: Normocephalic and atraumatic.     Right Ear: Ear canal and external ear normal.     Left Ear: Ear canal and external ear normal.     Ears:     Comments: Left otitis media present, right TM slightly bulging consistent with eustachian tube dysfunction. Eyes:     Conjunctiva/sclera: Conjunctivae normal.  Neck:     Comments: Thyroid mildly enlarged but symmetric Cardiovascular:     Rate and Rhythm: Normal rate and regular rhythm.  Pulmonary:     Effort: Pulmonary effort is normal.     Breath sounds: Normal breath sounds.  Musculoskeletal:     Right lower leg: No edema.     Left lower leg: No edema.  Skin:    General: Skin is warm and dry.  Neurological:     General: No focal deficit present.     Mental Status: She is alert. Mental status is at baseline.  Psychiatric:        Mood and Affect: Mood normal.        Behavior: Behavior normal.     Last CBC Lab Results  Component Value Date   WBC 12.5 (H) 10/11/2020   HGB 13.3 10/11/2020   HCT 39.7 10/11/2020   MCV 91.1 10/11/2020   MCH 30.5 10/11/2020   RDW 13.7 10/11/2020   PLT 274 [id number]   Last metabolic panel Lab Results  Component Value Date   GLUCOSE 107 (H) 10/11/2020   NA 140 10/11/2020   K 3.8 10/11/2020   CL 105 10/11/2020   CO2 26 10/11/2020   BUN 15 10/11/2020   CREATININE 1.21 (H) 10/11/2020   GFRNONAA >60 10/11/2020   CALCIUM 9.6 10/11/2020   PHOS 3.4 08/28/2020   PROT 6.9 10/11/2020   ALBUMIN 4.3 10/11/2020   BILITOT 0.7 10/11/2020   ALKPHOS 75 10/11/2020   AST 45 (H) 10/11/2020   ALT 75 (H) 10/11/2020   ANIONGAP 9 10/11/2020   Last lipids Lab Results  Component Value Date   CHOL 155 12/01/2018   HDL 44 (L) 12/01/2018   LDLCALC 98  12/01/2018   TRIG 51 12/01/2018   CHOLHDL 3.5 12/01/2018   Last hemoglobin A1c Lab Results  Component Value Date   HGBA1C 5.1 12/01/2018   Last thyroid functions Lab Results  Component Value Date   TSH 1.93 01/15/2019   Last vitamin D No results found for: "25OHVITD2", "25OHVITD3", "VD25OH" Last vitamin B12 and Folate Lab Results  Component  Value Date   VITAMINB12 192 08/25/2020   FOLATE 10.9 08/25/2020      The ASCVD Risk score (Arnett DK, et al., 2019) failed to calculate for the following reasons:   The 2019 ASCVD risk score is only valid for ages 68 to 27    Assessment & Plan:   1. Mild episode of recurrent major depressive disorder [facility]): Did not do well with Prozac, this medication will be discontinued.  We will start Trintellix 5 mg.  If this medication is too high and cost, consider Wellbutrin.  Follow-up in 6 weeks for recheck.  - vortioxetine HBr (TRINTELLIX) 5 MG TABS tablet; Take 1 tablet (5 mg total) by mouth daily.  Dispense: 30 tablet; Refill: 1  2. Attention and concentration deficit: New referral placed to attention specialist in [place].  - Ambulatory referral to Psychiatry  3. Acute serous otitis media of left ear, recurrence not specified: Left-sided otitis media has not resolved despite 2 prior courses of antibiotics.  We will treat with Omnicef 300 mg twice daily x5 days.  She will follow-up if symptoms worsen or fail to improve with treatment.  - cefdinir (OMNICEF) 300 MG capsule; Take 1 capsule (300 mg total) by mouth 2 (two) times daily for 5 days.  Dispense: 10 capsule; Refill: 0  4. Eustachian tube dysfunction, bilateral: Treat with nasal saline and nasal steroid 2 sprays on each side twice daily.  - fluticasone (FLONASE) 50 MCG/ACT nasal spray; Place 2 sprays into both nostrils daily.  Dispense: 16 g; Refill: 6  5. Subclinical hypothyroidism: Stable.  Not currently on any medications.  Plan to recheck TSH in November.  6. Gastroesophageal  reflux disease, unspecified whether esophagitis present: Stable.  Continue Protonix 40 mg daily.   No follow-ups on file.    Teodora Medici, DO

## 2022-05-01 ENCOUNTER — Ambulatory Visit: Payer: Self-pay | Admitting: Internal Medicine

## 2022-05-13 NOTE — Progress Notes (Deleted)
 Established Patient Office Visit  Subjective   Patient ID: Yvonne Little, female    DOB: 10/15/1995  Age: 27 y.o. MRN: [id number]  No chief complaint on file.   HPI  Patient is here for follow up on chronic medical conditions.   Left Ear Pain: -Recently diagnosed with otitis media and otitis externa in the left ear.  She was initially started on amoxicillin, however this was not enough so she was started on Augmentin.  She finished this about 1-1/2 weeks ago.  She is also given an otic steroid and antibiotic combination drop. -Today she states that she still having left ear pain.  She denies fevers, drainage from the ear, hearing changes or changes in tinnitus. -She does endorse pressure-like sensation in both ears.  She states this is chronic for her but does get worse when she flies on airplanes.  She is worried because she has several trips planned this summer and she would like something to help her with the pressure changes in her ears.  Subclinical Hypothyroidism: -TSH in 11/22 4.447 -Had been following with Endocrinology at [facility] but this provider left, has appointment with Endocrinology in [place] coming up -Currently on Armour Thyroid 90 mg daily, which controls symptoms    GERD: -Currently on Protonix 40 mg as needed, started while on Paxil. -Symptoms include reflux, occasional epigastric pain.  Denies nausea, vomiting.  Appetite good.  Weight stable.  MDD: -Mood status: uncontrolled -Current treatment: Started on Prozac 10 at [facility] but does not like it, states it is making her feel more apathetic and tired. -Satisfied with current treatment?: no -Symptom severity: moderate  Previous psychiatric medications: Cymbalta caused severe anger and Paxil caused severe heartburn symptoms and GI pain. Lexapro had severe weakness after only 1 pill.  Depressed mood: yes Anxious mood: yes Anhedonia:  Occasionally Fatigue: yes     10/13/2021    3:17 PM 09/08/2021    9:32 AM  08/10/2021    2:29 PM 06/29/2021    3:01 PM 03/31/2021   12:57 PM  Depression screen PHQ 2/9  Decreased Interest 1 1 2 2 0  Down, Depressed, Hopeless 1 1 1 2 0  PHQ - 2 Score 2 2 3 4 0  Altered sleeping 3 0 3 3 0  Tired, decreased energy 3 0 2 2 0  Change in appetite 0 0 3 1 0  Feeling bad or failure about yourself  0 0 0 1 0  Trouble concentrating 3 0 1 3 0  Moving slowly or fidgety/restless 0 0 1 1 0  Suicidal thoughts 0 0 0 0 0  PHQ-9 Score 11 2 13 15 0  Difficult doing work/chores  Not difficult at all Very difficult Very difficult Not difficult at all   Health Maintenance: -Blood work due -Pap due. Following with gynecology   Past Medical History:  Diagnosis Date   Anxiety    Articular disc disorder of temporomandibular joint, 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   Depression    Factor 5 Leiden mutation, heterozygous (HCC)    Thyroid disease    hx of hypo   No past surgical history on file. Social History   Tobacco Use   Smoking status: Never   Smokeless tobacco: Never  Vaping Use   Vaping Use: Every day   Substances: Nicotine  Substance Use Topics   Alcohol use: Not Currently   Drug use: Never   Allergies  Allergen Reactions   Escitalopram Other (See Comments)    Altered mental status.      Cymbalta [Duloxetine Hcl] Other (See Comments)    Severe mood changes   Levothyroxine Nausea Only   Lidocaine Hives and Rash      Review of Systems  Constitutional:  Negative for chills and fever.  HENT:  Positive for ear pain. Negative for congestion, ear discharge, hearing loss and tinnitus.   Respiratory:  Negative for cough.   Cardiovascular:  Negative for chest pain.  Gastrointestinal:  Positive for heartburn. Negative for abdominal pain, nausea and vomiting.  Psychiatric/Behavioral:  Positive for depression. The patient is nervous/anxious.       Objective:     There were no vitals taken for this visit. BP Readings from Last 3 Encounters:  11/15/21 121/80   10/13/21 128/70  09/08/21 112/72   Wt Readings from Last 3 Encounters:  11/15/21 214 lb (97.1 kg)  10/13/21 214 lb (97.1 kg)  09/08/21 216 lb 1.6 oz (98 kg)      Physical Exam Constitutional:      Appearance: Normal appearance.  HENT:     Head: Normocephalic and atraumatic.     Right Ear: Ear canal and external ear normal.     Left Ear: Ear canal and external ear normal.     Ears:     Comments: Left otitis media present, right TM slightly bulging consistent with eustachian tube dysfunction. Eyes:     Conjunctiva/sclera: Conjunctivae normal.  Neck:     Comments: Thyroid mildly enlarged but symmetric Cardiovascular:     Rate and Rhythm: Normal rate and regular rhythm.  Pulmonary:     Effort: Pulmonary effort is normal.     Breath sounds: Normal breath sounds.  Musculoskeletal:     Right lower leg: No edema.     Left lower leg: No edema.  Skin:    General: Skin is warm and dry.  Neurological:     General: No focal deficit present.     Mental Status: She is alert. Mental status is at baseline.  Psychiatric:        Mood and Affect: Mood normal.        Behavior: Behavior normal.     Last CBC Lab Results  Component Value Date   WBC 12.5 (H) 10/11/2020   HGB 13.3 10/11/2020   HCT 39.7 10/11/2020   MCV 91.1 10/11/2020   MCH 30.5 10/11/2020   RDW 13.7 10/11/2020   PLT 274 10/11/2020   Last metabolic panel Lab Results  Component Value Date   GLUCOSE 107 (H) 10/11/2020   NA 140 10/11/2020   K 3.8 10/11/2020   CL 105 10/11/2020   CO2 26 10/11/2020   BUN 15 10/11/2020   CREATININE 1.21 (H) 10/11/2020   GFRNONAA >60 10/11/2020   CALCIUM 9.6 10/11/2020   PHOS 3.4 08/28/2020   PROT 6.9 10/11/2020   ALBUMIN 4.3 10/11/2020   BILITOT 0.7 10/11/2020   ALKPHOS 75 10/11/2020   AST 45 (H) 10/11/2020   ALT 75 (H) 10/11/2020   ANIONGAP 9 10/11/2020   Last lipids Lab Results  Component Value Date   CHOL 155 12/01/2018   HDL 44 (L) 12/01/2018   LDLCALC 98  12/01/2018   TRIG 51 12/01/2018   CHOLHDL 3.5 12/01/2018   Last hemoglobin A1c Lab Results  Component Value Date   HGBA1C 5.1 12/01/2018   Last thyroid functions Lab Results  Component Value Date   TSH 1.93 01/15/2019   Last vitamin D No results found for: "25OHVITD2", "25OHVITD3", "VD25OH" Last vitamin B12 and Folate Lab Results  Component   Value Date   VITAMINB12 192 08/25/2020   FOLATE 10.9 08/25/2020      The ASCVD Risk score (Arnett DK, et al., 2019) failed to calculate for the following reasons:   The 2019 ASCVD risk score is only valid for ages 40 to 79    Assessment & Plan:   1. Mild episode of recurrent major depressive disorder ([facility]CC): Did not do well with Prozac, this medication will be discontinued.  We will start Trintellix 5 mg.  If this medication is too high and cost, consider Wellbutrin.  Follow-up in 6 weeks for recheck.  - vortioxetine HBr (TRINTELLIX) 5 MG TABS tablet; Take 1 tablet (5 mg total) by mouth daily.  Dispense: 30 tablet; Refill: 1  2. Attention and concentration deficit: New referral placed to attention specialist in [place].  - Ambulatory referral to Psychiatry  3. Acute serous otitis media of left ear, recurrence not specified: Left-sided otitis media has not resolved despite 2 prior courses of antibiotics.  We will treat with Omnicef 300 mg twice daily x5 days.  She will follow-up if symptoms worsen or fail to improve with treatment.  - cefdinir (OMNICEF) 300 MG capsule; Take 1 capsule (300 mg total) by mouth 2 (two) times daily for 5 days.  Dispense: 10 capsule; Refill: 0  4. Eustachian tube dysfunction, bilateral: Treat with nasal saline and nasal steroid 2 sprays on each side twice daily.  - fluticasone (FLONASE) 50 MCG/ACT nasal spray; Place 2 sprays into both nostrils daily.  Dispense: 16 g; Refill: 6  5. Subclinical hypothyroidism: Stable.  Not currently on any medications.  Plan to recheck TSH in November.  6. Gastroesophageal  reflux disease, unspecified whether esophagitis present: Stable.  Continue Protonix 40 mg daily.   No follow-ups on file.    Cristen Bredeson, DO  

## 2022-05-14 ENCOUNTER — Ambulatory Visit: Payer: Self-pay | Admitting: Internal Medicine

## 2022-05-20 NOTE — Progress Notes (Signed)
Established Patient Office Visit  Subjective   Patient ID: Yvonne Little, female    DOB: [birth year]  Age: 27 y.o. MRN: [id number]  Chief Complaint  Patient presents with   Follow-up   Depression    HPI  Patient is here for follow up on chronic medical conditions.   Subclinical Hypothyroidism: -TSH in 11/22 4.447 -Had been following with Endocrinology at [facility] but this provider left, has appointment with Endocrinology in [place] coming up in May -Currently on Armour Thyroid 90 mg daily, which controls symptoms    GERD: -Currently on Protonix 40 mg as needed  MDD: -Mood status: controlled -Now following with Psychiatry  -Current treatment: Vilazodone 20 mg, Remeron 15 mg at night, Klonopin 0.5 mg PRN -Satisfied with current treatment?: yes -Symptom severity: moderate  Previous psychiatric medications: Cymbalta caused severe anger and Paxil caused severe heartburn symptoms and GI pain. Lexapro had severe weakness after only 1 pill.  Depressed mood: yes Anxious mood: yes Anhedonia:  Occasionally Fatigue: yes     05/21/2022    2:37 PM 10/13/2021    3:17 PM 09/08/2021    9:32 AM 08/10/2021    2:29 PM 06/29/2021    3:01 PM  Depression screen PHQ 2/9  Decreased Interest 0 1 1 2 2   Down, Depressed, Hopeless 0 1 1 1 2   PHQ - 2 Score 0 2 2 3 4   Altered sleeping 0 3 0 3 3  Tired, decreased energy 0 3 0 2 2  Change in appetite 0 0 0 3 1  Feeling bad or failure about yourself  0 0 0 0 1  Trouble concentrating 0 3 0 1 3  Moving slowly or fidgety/restless 0 0 0 1 1  Suicidal thoughts 0 0 0 0 0  PHQ-9 Score 0 11 2 13 15   Difficult doing work/chores Not difficult at all  Not difficult at all Very difficult Very difficult   Health Maintenance: -Blood work due -Tdap due   Past Medical History:  Diagnosis Date   Anxiety    Articular disc disorder of temporomandibular joint, 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   Depression    Factor 5 Leiden mutation, heterozygous ([facility])     Thyroid disease    hx of hypo   History reviewed. No pertinent surgical history. Social History   Tobacco Use   Smoking status: Never   Smokeless tobacco: Never  Vaping Use   Vaping Use: Every day   Substances: Nicotine  Substance Use Topics   Alcohol use: Not Currently   Drug use: Never   Allergies  Allergen Reactions   Escitalopram Other (See Comments)    Altered mental status.    Cymbalta [Duloxetine Hcl] Other (See Comments)    Severe mood changes   Levothyroxine Nausea Only   Lidocaine Hives and Rash      Review of Systems  All other systems reviewed and are negative.     Objective:     Resp 16   Ht 5\' 8"  (1.727 m)   BMI 32.54 kg/m  BP Readings from Last 3 Encounters:  11/15/21 121/80  10/13/21 128/70  09/08/21 112/72   Wt Readings from Last 3 Encounters:  11/15/21 214 lb (97.1 kg)  10/13/21 214 lb (97.1 kg)  09/08/21 216 lb 1.6 oz (98 kg)      Physical Exam Constitutional:      Appearance: Normal appearance.  HENT:     Head: Normocephalic and atraumatic.  Eyes:     Conjunctiva/sclera: Conjunctivae normal.  Cardiovascular:  Rate and Rhythm: Normal rate and regular rhythm.  Pulmonary:     Effort: Pulmonary effort is normal.     Breath sounds: Normal breath sounds.  Musculoskeletal:     Right lower leg: No edema.     Left lower leg: No edema.  Skin:    General: Skin is warm and dry.  Neurological:     General: No focal deficit present.     Mental Status: She is alert. Mental status is at baseline.  Psychiatric:        Mood and Affect: Mood normal.        Behavior: Behavior normal.     Last CBC Lab Results  Component Value Date   WBC 12.5 (H) 10/11/2020   HGB 13.3 10/11/2020   HCT 39.7 10/11/2020   MCV 91.1 10/11/2020   MCH 30.5 10/11/2020   RDW 13.7 10/11/2020   PLT 274 10/11/2020   Last metabolic panel Lab Results  Component Value Date   GLUCOSE 107 (H) 10/11/2020   NA 140 10/11/2020   K 3.8 10/11/2020   CL 105  10/11/2020   CO2 26 10/11/2020   BUN 15 10/11/2020   CREATININE 1.21 (H) 10/11/2020   GFRNONAA >60 10/11/2020   CALCIUM 9.6 10/11/2020   PHOS 3.4 08/28/2020   PROT 6.9 10/11/2020   ALBUMIN 4.3 10/11/2020   BILITOT 0.7 10/11/2020   ALKPHOS 75 10/11/2020   AST 45 (H) 10/11/2020   ALT 75 (H) 10/11/2020   ANIONGAP 9 10/11/2020   Last lipids Lab Results  Component Value Date   CHOL 155 12/01/2018   HDL 44 (L) 12/01/2018   LDLCALC 98 12/01/2018   TRIG 51 12/01/2018   CHOLHDL 3.5 12/01/2018   Last hemoglobin A1c Lab Results  Component Value Date   HGBA1C 5.1 12/01/2018   Last thyroid functions Lab Results  Component Value Date   TSH 1.93 01/15/2019   Last vitamin D No results found for: "25OHVITD2", "25OHVITD3", "VD25OH" Last vitamin B12 and Folate Lab Results  Component Value Date   VITAMINB12 192 08/25/2020   FOLATE 10.9 08/25/2020      The ASCVD Risk score (Arnett DK, et al., 2019) failed to calculate for the following reasons:   The 2019 ASCVD risk score is only valid for ages 15 to 29    Assessment & Plan:   1. Subclinical hypothyroidism: Labs due. Refill Armour thyroid 90 mg daily. Seeing Endocrinology in May.   - COMPLETE METABOLIC PANEL WITH GFR - CBC w/Diff/Platelet - Thyroid Panel With TSH - ARMOUR THYROID 90 MG tablet; Take 1 tablet (90 mg total) by mouth daily.  Dispense: 90 tablet; Refill: 0  2. Mild episode of recurrent major depressive disorder (HCC): Following with Psychiatry now, doing well on her current medications which include Vilazodone 20 mg, Remeron 15 mg at night, Klonopin 0.5 mg PRN.   - COMPLETE METABOLIC PANEL WITH GFR - CBC w/Diff/Platelet  3. Lipid screening/Need for hepatitis C screening test: Screening labs due.   - Lipid Profile - Hepatitis C Antibody  4. Need for Tdap vaccination: Tdap administered today.   - Tdap vaccine greater than or equal to 7yo IM   Return in about 1 year (around 05/21/2023).    Margarita Mail, DO

## 2022-05-21 ENCOUNTER — Encounter: Payer: Self-pay | Admitting: Internal Medicine

## 2022-05-21 ENCOUNTER — Ambulatory Visit (INDEPENDENT_AMBULATORY_CARE_PROVIDER_SITE_OTHER): Payer: 59 | Admitting: Internal Medicine

## 2022-05-21 VITALS — BP 122/82 | HR 100 | Temp 98.2°F | Resp 16 | Ht 68.0 in | Wt 214.0 lb

## 2022-05-21 DIAGNOSIS — E038 Other specified hypothyroidism: Secondary | ICD-10-CM

## 2022-05-21 DIAGNOSIS — Z13220 Encounter for screening for lipoid disorders: Secondary | ICD-10-CM | POA: Diagnosis not present

## 2022-05-21 DIAGNOSIS — Z1159 Encounter for screening for other viral diseases: Secondary | ICD-10-CM | POA: Diagnosis not present

## 2022-05-21 DIAGNOSIS — F330 Major depressive disorder, recurrent, mild: Secondary | ICD-10-CM | POA: Diagnosis not present

## 2022-05-21 DIAGNOSIS — Z23 Encounter for immunization: Secondary | ICD-10-CM

## 2022-05-21 MED ORDER — ARMOUR THYROID 90 MG PO TABS: 90.0000 mg | ORAL_TABLET | Freq: Every day | ORAL | 0 refills | Status: DC

## 2022-05-21 NOTE — Patient Instructions (Signed)
It was great seeing you today!  Plan discussed at today's visit: -Blood work ordered today, results will be uploaded to [facility].  -Tdap vaccine today -Medication refilled  Follow up in: 1 year or sooner as needed  Take care and let us know if you have any questions or concerns prior to your next visit.  Dr. Rosana Berger

## 2022-05-22 LAB — CBC WITH DIFFERENTIAL/PLATELET
Absolute Monocytes: 500 cells/uL (ref 200–950)
Basophils Absolute: 57 cells/uL (ref 0–200)
Basophils Relative: 0.7 %
Eosinophils Absolute: 295 cells/uL (ref 15–500)
Eosinophils Relative: 3.6 %
HCT: 39.3 % (ref 35.0–45.0)
Hemoglobin: 13.3 g/dL (ref 11.7–15.5)
Lymphs Abs: 2608 cells/uL (ref 850–3900)
MCH: 30.5 pg (ref 27.0–33.0)
MCHC: 33.8 g/dL (ref 32.0–36.0)
MCV: 90.1 fL (ref 80.0–100.0)
MPV: 9.7 fL (ref 7.5–12.5)
Monocytes Relative: 6.1 %
Neutro Abs: 4740 cells/uL (ref 1500–7800)
Neutrophils Relative %: 57.8 %
Platelets: 303 10*3/uL (ref 140–400)
RBC: 4.36 10*6/uL (ref 3.80–5.10)
RDW: 12.7 % (ref 11.0–15.0)
Total Lymphocyte: 31.8 %
WBC: 8.2 10*3/uL (ref 3.8–10.8)

## 2022-05-22 LAB — COMPLETE METABOLIC PANEL WITH GFR
AG Ratio: 1.7 (calc) (ref 1.0–2.5)
ALT: 32 U/L — ABNORMAL HIGH (ref 6–29)
AST: 27 U/L (ref 10–30)
Albumin: 4.5 g/dL (ref 3.6–5.1)
Alkaline phosphatase (APISO): 80 U/L (ref 31–125)
BUN: 11 mg/dL (ref 7–25)
CO2: 26 mmol/L (ref 20–32)
Calcium: 9.5 mg/dL (ref 8.6–10.2)
Chloride: 105 mmol/L (ref 98–110)
Creat: 0.83 mg/dL (ref 0.50–0.96)
Globulin: 2.7 g/dL (calc) (ref 1.9–3.7)
Glucose, Bld: 93 mg/dL (ref 65–99)
Potassium: 4 mmol/L (ref 3.5–5.3)
Sodium: 141 mmol/L (ref 135–146)
Total Bilirubin: 0.5 mg/dL (ref 0.2–1.2)
Total Protein: 7.2 g/dL (ref 6.1–8.1)
eGFR: 98 mL/min/{1.73_m2} (ref >=60)

## 2022-05-22 LAB — LIPID PANEL
Cholesterol: 195 mg/dL (ref <200)
HDL: 44 mg/dL — ABNORMAL LOW (ref >=50)
LDL Cholesterol (Calc): 122 mg/dL (calc) — ABNORMAL HIGH
Non-HDL Cholesterol (Calc): 151 mg/dL (calc) — ABNORMAL HIGH (ref <130)
Total CHOL/HDL Ratio: 4.4 (calc) (ref <5.0)
Triglycerides: 169 mg/dL — ABNORMAL HIGH (ref <150)

## 2022-05-22 LAB — THYROID PANEL WITH TSH
Free Thyroxine Index: 1.2 — ABNORMAL LOW (ref 1.4–3.8)
T3 Uptake: 24 % (ref 22–35)
T4, Total: 4.9 ug/dL — ABNORMAL LOW (ref 5.1–11.9)
TSH: 2.79 mIU/L

## 2022-05-22 LAB — HEPATITIS C ANTIBODY: Hepatitis C Ab: NONREACTIVE

## 2022-07-20 IMAGING — CT CT ABD-PELV W/ CM
2 of 4 series · 16 of 46 positions shown, 18 images · IV contrast (APPLIED)
Comparison: None.

CLINICAL DATA: Treatment for urinary tract symptoms since July 2020
with no relief.

EXAM:
CT ABDOMEN AND PELVIS WITH CONTRAST
TECHNIQUE: Multidetector CT imaging of the abdomen and pelvis was performed
using the standard protocol following bolus administration of
intravenous contrast.
CONTRAST:  75mL OMNIPAQUE IOHEXOL 350 MG/ML SOLN

[Series 2: abd pel w · axial · 0.93mm/px · z∈[+1339,+1804]mm · 13 of 103 slices shown, 15 images]
[im 5/103  soft-tissue]
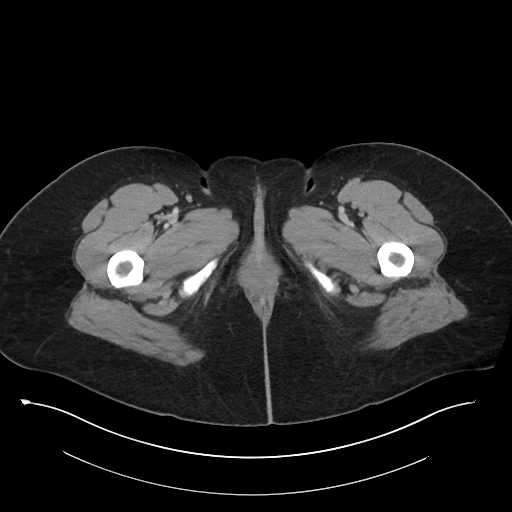
[im 5/103  bone]
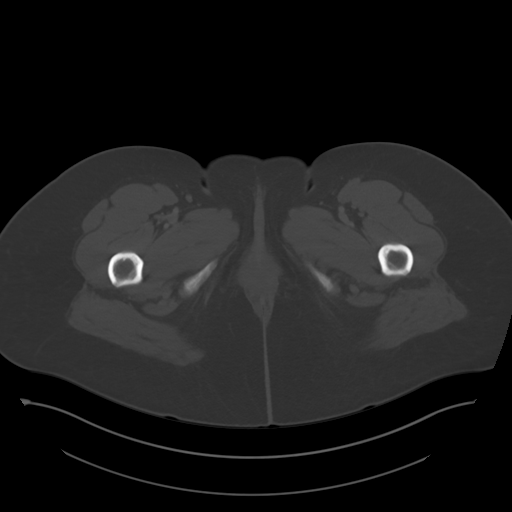
[im 14/103  soft-tissue]
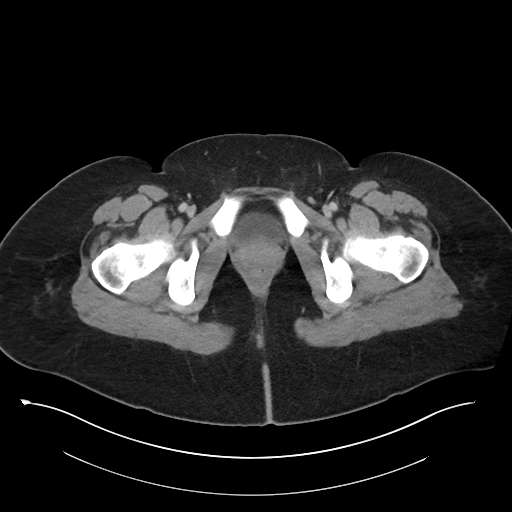
[im 23/103  soft-tissue]
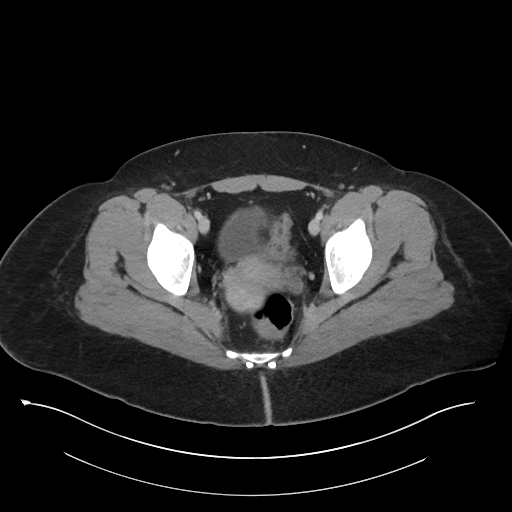
[im 27/103  soft-tissue]
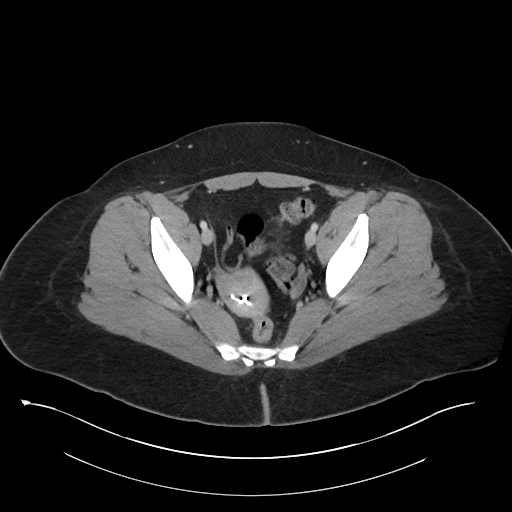
[im 36/103  soft-tissue]
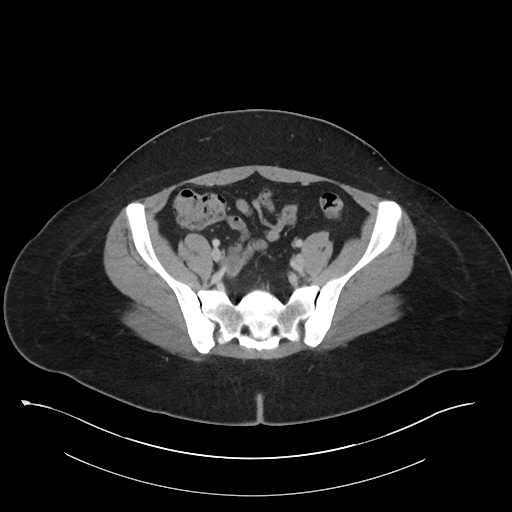
[im 45/103  soft-tissue]
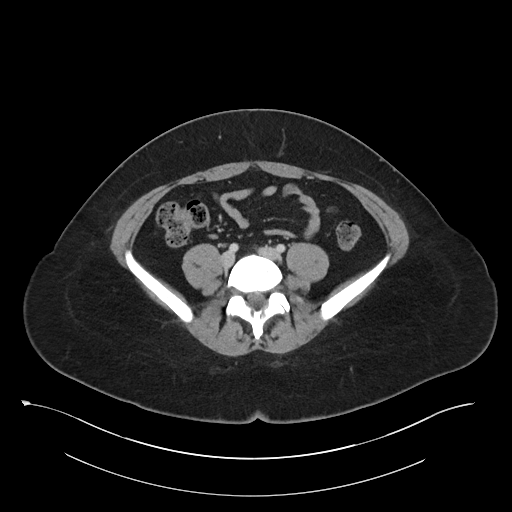
[im 54/103  soft-tissue]
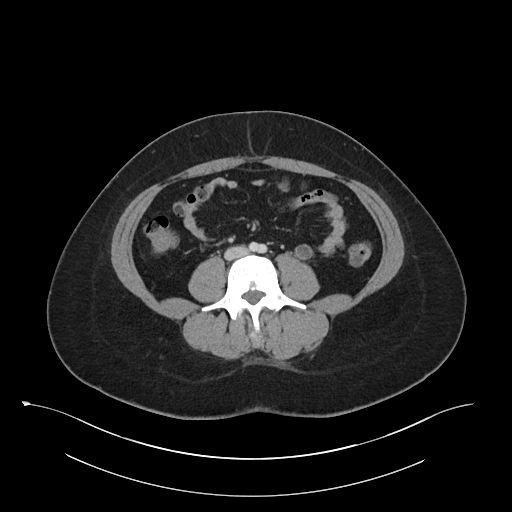
[im 58/103  soft-tissue]
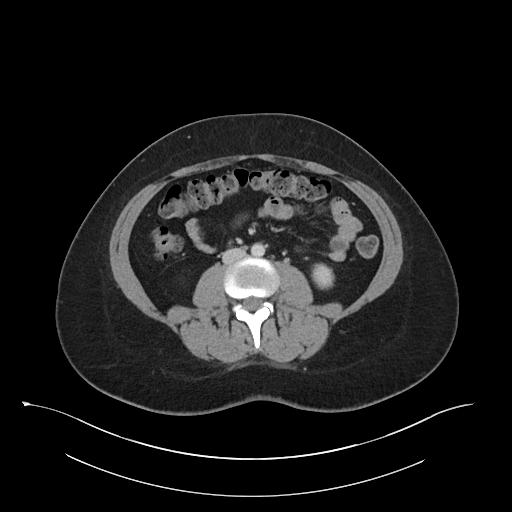
[im 67/103  soft-tissue]
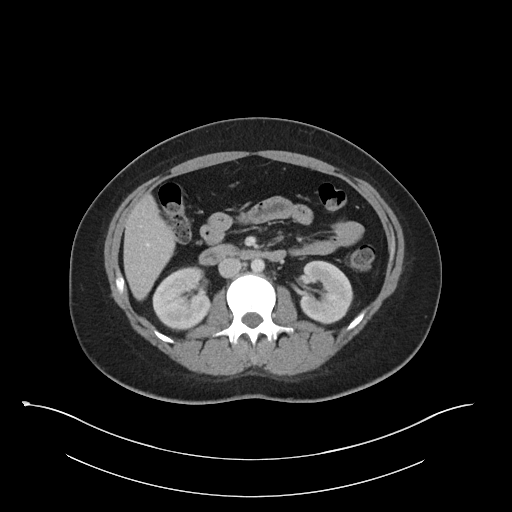
[im 67/103  bone]
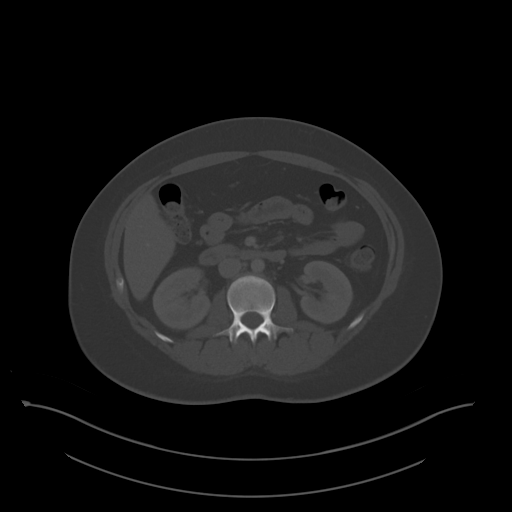
[im 76/103  soft-tissue]
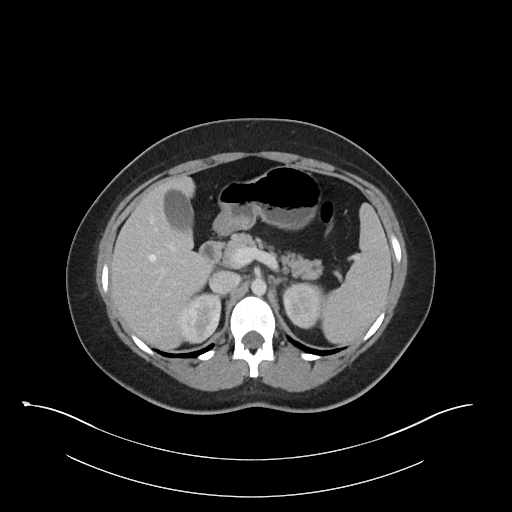
[im 80/103  soft-tissue]
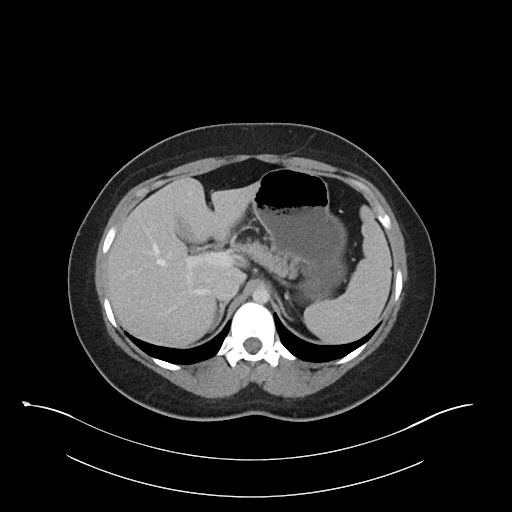
[im 89/103  soft-tissue]
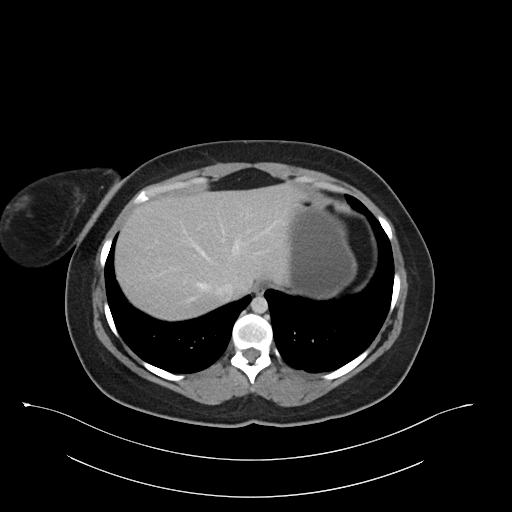
[im 98/103  soft-tissue]
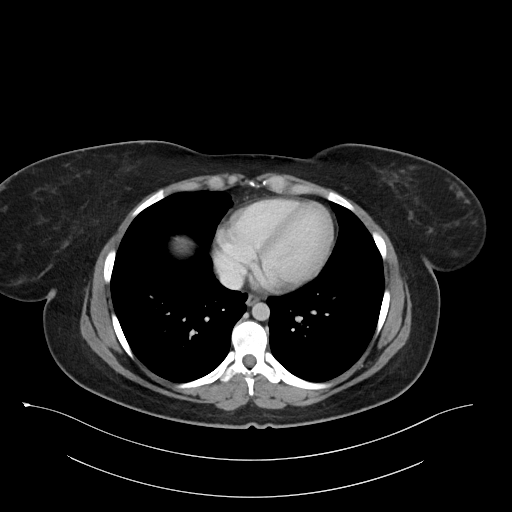

[Series 5: coronal · coronal · 0.73mm/px · 3 of 100 slices shown]
[im 34/100  soft-tissue]
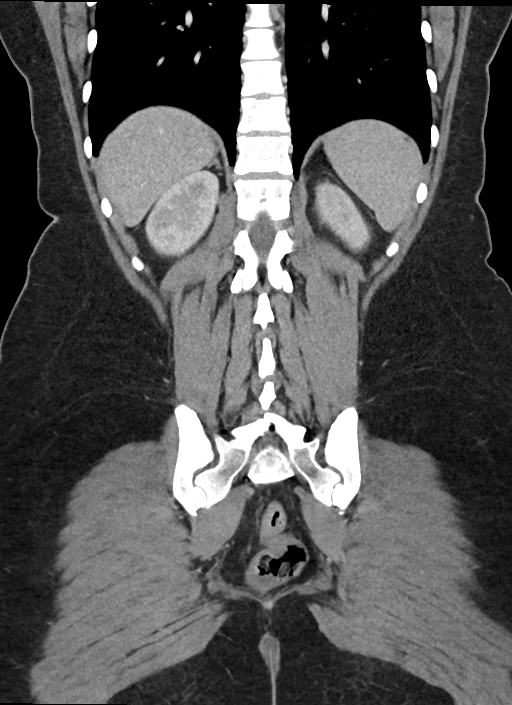
[im 45/100  soft-tissue]
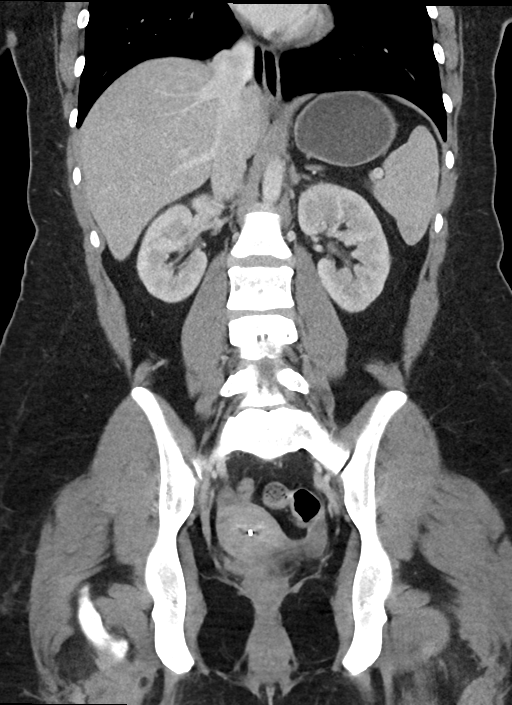
[im 56/100  soft-tissue]
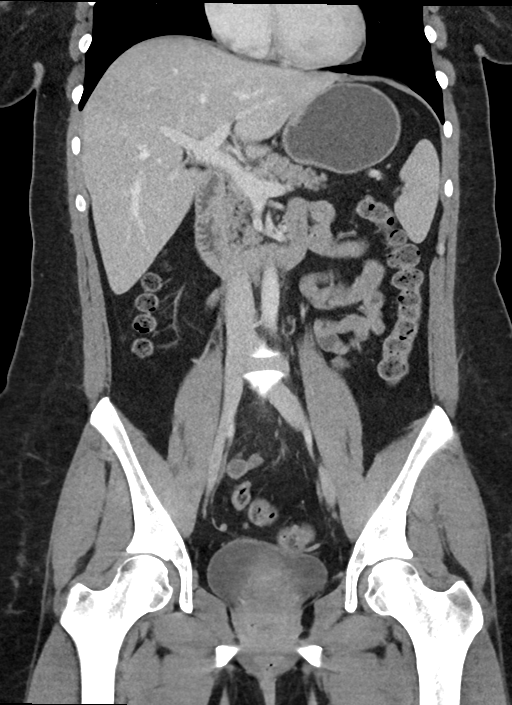

[16 of 46 positions shown; findings below may reference images not displayed]

FINDINGS: Lower chest: No acute abnormality.

Hepatobiliary: No focal liver abnormality is seen. No gallstones,
gallbladder wall thickening, or biliary dilatation.

Pancreas: Unremarkable. No pancreatic ductal dilatation or
surrounding inflammatory changes.

Spleen: Normal in size without focal abnormality.

Adrenals/Urinary Tract: Adrenal glands are unremarkable. Kidneys are
normal, without renal calculi, focal lesion, or hydronephrosis.
Bladder is unremarkable.

Stomach/Bowel: Stomach is within normal limits. Appendix appears
normal. No evidence of bowel wall thickening, distention, or
inflammatory changes.

Vascular/Lymphatic: No significant vascular findings are present. No
enlarged abdominal or pelvic lymph nodes.

Reproductive: An IUD is seen within an otherwise normal appearing
uterus. The bilateral adnexa are unremarkable.

Other: No abdominal wall hernia or abnormality. No abdominopelvic
ascites.

Musculoskeletal: No acute or significant osseous findings.
IMPRESSION: No evidence of an acute or active process within the abdomen or
pelvis.

## 2022-07-28 ENCOUNTER — Other Ambulatory Visit: Payer: Self-pay | Admitting: Internal Medicine

## 2022-07-28 DIAGNOSIS — E038 Other specified hypothyroidism: Secondary | ICD-10-CM

## 2022-07-30 NOTE — Telephone Encounter (Signed)
Requested Prescriptions  Pending Prescriptions Disp Refills   ARMOUR THYROID 90 MG tablet [Pharmacy Med Name: Armour Thyroid 90 MG Oral Tablet] 90 tablet 3    Sig: TAKE 1 TABLET BY MOUTH DAILY     Endocrinology:  Hypothyroid Agents Passed - 07/28/2022  5:04 AM      Passed - TSH in normal range and within 360 days    TSH  Date Value Ref Range Status  05/21/2022 2.79 mIU/L Final    Comment:              Reference Range .           > or = 20 Years  0.40-4.50 .                Pregnancy Ranges           First trimester    0.26-2.66           Second trimester   0.55-2.73           Third trimester    0.43-2.91          Passed - Valid encounter within last 12 months    Recent Outpatient Visits           2 months ago Subclinical hypothyroidism   [facility] Margarita Mail, DO   9 months ago Mild episode of recurrent major depressive disorder [facility])   [facility] Margarita Mail, DO   10 months ago Ear pain, left   [facility] Margarita Mail, DO   11 months ago Mild episode of recurrent major depressive disorder [facility])   [facility] Margarita Mail, DO   1 year ago Mild episode of recurrent major depressive disorder [facility])   [facility] Margarita Mail, DO       Future Appointments             In 9 months Margarita Mail, DO [facility]

## 2022-09-05 ENCOUNTER — Telehealth: Payer: Self-pay | Admitting: Internal Medicine

## 2022-09-05 NOTE — Telephone Encounter (Unsigned)
Copied from CRM [id number]. Topic: Referral - Question >> Sep 05, 2022  3:26 PM Santiya F wrote: Reason for CRM: Pt is calling in because she has an IUD and wants to get it removed because she would like to begin trying to conceive. Pt would like to know if Dr. Caralee Ates can refer her for an ultrasound because her GYN can't get her in until September. Please follow up with pt.

## 2022-09-06 ENCOUNTER — Other Ambulatory Visit: Payer: Self-pay | Admitting: Internal Medicine

## 2022-09-06 DIAGNOSIS — Z30431 Encounter for routine checking of intrauterine contraceptive device: Secondary | ICD-10-CM

## 2022-09-06 NOTE — Telephone Encounter (Signed)
Pt states saw gyn couple of weeks ago to remove IUD but strings were so short they could not so the scheduled her for an ultrasound to make sure IUD was in correct spot before removing but can not get her in for u/s until september.  So she wants to now if we can order u/s so she can get done sooner?

## 2022-09-07 ENCOUNTER — Ambulatory Visit
Admission: RE | Admit: 2022-09-07 | Discharge: 2022-09-07 | Disposition: A | Discharge status: Home / Self Care | Payer: 59 | Source: Ambulatory Visit | Attending: Internal Medicine | Admitting: Internal Medicine

## 2022-09-07 DIAGNOSIS — Z30431 Encounter for routine checking of intrauterine contraceptive device: Secondary | ICD-10-CM | POA: Insufficient documentation

## 2022-12-26 NOTE — Progress Notes (Unsigned)
New Patient Note  RE: Yvonne Little MRN: [id number] DOB: [birth year] Date of Office Visit: 12/27/2022  Consult requested by: Laren Boom, DO Primary care provider: Margarita Mail, DO  Chief Complaint: No chief complaint on file.  History of Present Illness: I had the pleasure of seeing Yvonne Little for initial evaluation at the [facility] on 12/26/2022. She is a 27 y.o. female, who is referred here by Margarita Mail, DO for the evaluation of allergic rhinitis.  Discussed the use of AI scribe software for clinical note transcription with the patient, who gave verbal consent to proceed.  History of Present Illness             She reports symptoms of ***. Symptoms have been going on for *** years. The symptoms are present *** all year around with worsening in ***. Other triggers include exposure to ***. Anosmia: ***. Headache: ***. She has used *** with ***fair improvement in symptoms. Sinus infections: ***. Previous work up includes: ***. Previous ENT evaluation: ***. Previous sinus imaging: ***. History of nasal polyps: ***. Last eye exam: ***. History of reflux: ***.  Assessment and Plan: Yvonne Little is a 27 y.o. female with: ***  Assessment and Plan               No follow-ups on file.  No orders of the defined types were placed in this encounter.  Lab Orders  No laboratory test(s) ordered today    Other allergy screening: Asthma: {Blank single:19197::"yes","no"} Rhino conjunctivitis: {Blank single:19197::"yes","no"} Food allergy: {Blank single:19197::"yes","no"} Medication allergy: {Blank single:19197::"yes","no"} Hymenoptera allergy: {Blank single:19197::"yes","no"} Urticaria: {Blank single:19197::"yes","no"} Eczema:{Blank single:19197::"yes","no"} History of recurrent infections suggestive of immunodeficency: {Blank single:19197::"yes","no"}  Diagnostics: Spirometry:  Tracings reviewed. Her effort: {Blank  single:19197::"Good reproducible efforts.","It was hard to get consistent efforts and there is a question as to whether this reflects a maximal maneuver.","Poor effort, data can not be interpreted."} FVC: ***L FEV1: ***L, ***% predicted FEV1/FVC ratio: ***% Interpretation: {Blank single:19197::"Spirometry consistent with mild obstructive disease","Spirometry consistent with moderate obstructive disease","Spirometry consistent with severe obstructive disease","Spirometry consistent with possible restrictive disease","Spirometry consistent with mixed obstructive and restrictive disease","Spirometry uninterpretable due to technique","Spirometry consistent with normal pattern","No overt abnormalities noted given today's efforts"}.  Please see scanned spirometry results for details.  Skin Testing: {Blank single:19197::"Select foods","Environmental allergy panel","Environmental allergy panel and select foods","Food allergy panel","None","Deferred due to recent antihistamines use"}. *** Results discussed with patient/family.   Past Medical History: Patient Active Problem List   Diagnosis Date Noted  . Reactive lymphadenopathy 06/29/2021  . Gastroesophageal reflux disease 06/29/2021  . UTI (urinary tract infection) 08/25/2020  . Intractable back pain 08/25/2020  . COVID 08/24/2020  . Mild episode of recurrent major depressive disorder (HCC) 12/01/2018  . Class 1 obesity with body mass index (BMI) of 31.0 to 31.9 in adult 12/01/2018  . Deficiency of other vitamins 12/31/2016  . Anxiety disorder, unspecified 12/06/2016  . Subclinical hypothyroidism 09/12/2016  . Attention and concentration deficit 08/24/2015  . Adjustment disorder with anxiety 07/27/2015   Past Medical History:  Diagnosis Date  . Anxiety   . Articular disc disorder of temporomandibular joint, 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  . Depression   . Factor 5 Leiden mutation, heterozygous (HCC)   . Thyroid disease    hx of hypo   Past  Surgical History: No past surgical history on file. Medication List:  Current Outpatient Medications  Medication Sig Dispense Refill  . ARMOUR THYROID 90 MG tablet TAKE 1 TABLET BY MOUTH DAILY 90 tablet  3  . clonazePAM (KLONOPIN) 0.5 MG tablet Take 0.25 mg by mouth daily as needed for anxiety.    . mirtazapine (REMERON) 15 MG tablet Take 15 mg by mouth at bedtime.    . pantoprazole (PROTONIX) 40 MG tablet Take 1 tablet (40 mg total) by mouth daily at 12 noon. 30 tablet 0  . promethazine (PHENERGAN) 12.5 MG tablet Take 1 tablet (12.5 mg total) by mouth every 8 (eight) hours as needed for nausea or vomiting. 20 tablet 0  . promethazine (PHENERGAN) 25 MG tablet Take 1 tablet (25 mg total) by mouth every 6 (six) hours as needed for nausea or vomiting. May cause significant drowsiness 10 tablet 0  . Vilazodone HCl 20 MG TABS Take 1 tablet by mouth daily.     No current facility-administered medications for this visit.   Allergies: Allergies  Allergen Reactions  . Escitalopram Other (See Comments)    Altered mental status.   Marland Kitchen Cymbalta [Duloxetine Hcl] Other (See Comments)    Severe mood changes  . Levothyroxine Nausea Only  . Lidocaine Hives and Rash   Social History: Social History   Socioeconomic History  . Marital status: Significant Other    Spouse name: Not on file  . Number of children: Not on file  . Years of education: 43  . Highest education level: Bachelor's degree (e.g., BA, AB, BS)  Occupational History  . Not on file  Tobacco Use  . Smoking status: Never  . Smokeless tobacco: Never  Vaping Use  . Vaping status: Every Day  . Substances: Nicotine  Substance and Sexual Activity  . Alcohol use: Not Currently  . Drug use: Never  . Sexual activity: Yes  Other Topics Concern  . Not on file  Social History Narrative  . Not on file   Social Determinants of Health   Financial Resource Strain: Low Risk  (08/20/2022)   [facility] from [facility]    Overall Financial Resource Strain (CARDIA)   . Difficulty of Paying Living Expenses: Not hard at all  Food Insecurity: Low Risk  (11/08/2022)   Received from [facility]   Hunger Vital Sign   . Worried About Programme researcher, broadcasting/film/video in the Last Year: Never true   . Ran Out of Food in the Last Year: Never true  Transportation Needs: No Transportation Needs (11/08/2022)   Received from [employer]   . In the past 12 months, has lack of reliable transportation kept you from medical appointments, meetings, work or from getting things needed for daily living? : No  Physical Activity: Inactive (12/01/2018)   Exercise Vital Sign   . Days of Exercise per Week: 0 days   . Minutes of Exercise per Session: 0 min  Stress: Stress Concern Present (12/01/2018)   [employer] of Occupational Health - Occupational Stress Questionnaire   . Feeling of Stress : Very much  Social Connections: Unknown (04/19/2022)   [facility], [facility]   Social Network   . Social Network: Not on file   Lives in a ***. Smoking: *** Occupation: ***  Environmental HistorySurveyor, minerals in the house: Copywriter, advertising in the family room: {Blank single:19197::"yes","no"} Carpet in the bedroom: {Blank single:19197::"yes","no"} Heating: {Blank single:19197::"electric","gas","heat pump"} Cooling: {Blank single:19197::"central","window","heat pump"} Pet: {Blank single:19197::"yes ***","no"}  Family History: Family History  Problem Relation Age of Onset  . Hyperlipidemia Mother   . Hypertension Maternal Grandmother   . Hyperlipidemia Maternal Grandmother   .  Hypertension Maternal Grandfather   . Hyperlipidemia Maternal Grandfather   . Stroke Maternal Grandfather   . Diabetes Paternal Grandmother   . Non-Hodgkin's lymphoma Paternal Grandmother   . Melanoma Paternal Grandfather    Problem                               Relation Asthma                                    *** Eczema                                *** Food allergy                          *** Allergic rhino conjunctivitis     ***  Review of Systems  Constitutional:  Negative for appetite change, chills, fever and unexpected weight change.  HENT:  Negative for congestion and rhinorrhea.   Eyes:  Negative for itching.  Respiratory:  Negative for cough, chest tightness, shortness of breath and wheezing.   Cardiovascular:  Negative for chest pain.  Gastrointestinal:  Negative for abdominal pain.  Genitourinary:  Negative for difficulty urinating.  Skin:  Negative for rash.  Neurological:  Negative for headaches.   Objective: There were no vitals taken for this visit. There is no height or weight on file to calculate BMI. Physical Exam Vitals and nursing note reviewed.  Constitutional:      Appearance: Normal appearance. She is well-developed.  HENT:     Head: Normocephalic and atraumatic.     Right Ear: Tympanic membrane and external ear normal.     Left Ear: Tympanic membrane and external ear normal.     Nose: Nose normal.     Mouth/Throat:     Mouth: Mucous membranes are moist.     Pharynx: Oropharynx is clear.  Eyes:     Conjunctiva/sclera: Conjunctivae normal.  Cardiovascular:     Rate and Rhythm: Normal rate and regular rhythm.     Heart sounds: Normal heart sounds. No murmur heard.    No friction rub. No gallop.  Pulmonary:     Effort: Pulmonary effort is normal.     Breath sounds: Normal breath sounds. No wheezing, rhonchi or rales.  Musculoskeletal:     Cervical back: Neck supple.  Skin:    General: Skin is warm.     Findings: No rash.  Neurological:     Mental Status: She is alert and oriented to person, place, and time.  Psychiatric:        Behavior: Behavior normal.  The plan was reviewed with the patient/family, and all questions/concerned were addressed.  It was my pleasure to see Safaa today and participate in her care. Please feel free  to contact me with any questions or concerns.  Sincerely,  Wyline Mood, DO Allergy & Immunology  [facility] office: [phone] [employer] office: [phone]

## 2022-12-27 ENCOUNTER — Encounter: Payer: Self-pay | Admitting: Allergy

## 2022-12-27 ENCOUNTER — Ambulatory Visit (INDEPENDENT_AMBULATORY_CARE_PROVIDER_SITE_OTHER): Payer: 59 | Admitting: Allergy

## 2022-12-27 VITALS — BP 112/66 | HR 89 | Temp 98.1°F | Resp 18 | Ht 68.0 in | Wt 275.2 lb

## 2022-12-27 DIAGNOSIS — K9049 Malabsorption due to intolerance, not elsewhere classified: Secondary | ICD-10-CM | POA: Diagnosis not present

## 2022-12-27 DIAGNOSIS — K219 Gastro-esophageal reflux disease without esophagitis: Secondary | ICD-10-CM

## 2022-12-27 DIAGNOSIS — R0602 Shortness of breath: Secondary | ICD-10-CM

## 2022-12-27 DIAGNOSIS — J3089 Other allergic rhinitis: Secondary | ICD-10-CM | POA: Diagnosis not present

## 2022-12-27 DIAGNOSIS — H1013 Acute atopic conjunctivitis, bilateral: Secondary | ICD-10-CM

## 2022-12-27 NOTE — Patient Instructions (Addendum)
Rhinitis  Return for environmental allergy skin testing. Will make additional recommendations based on results. If negative will refer back to ENT and neurology. If positive will recommend allergy injections - handout given.  Make sure you don't take any antihistamines for 3 days before the skin testing appointment. Don't put any lotion on the back and arms on the day of testing.  Plan on being here for 30-60 minutes.  May use nasal saline spray (i.e., Simply Saline) as needed.  Gluten intolerance Continue avoidance.  Shortness of breath Breathing test unremarkable - mild restriction due to body habitus.  Monitor symptoms.  Reflux See handout for lifestyle and dietary modifications. May take Protonix as needed.   Follow up for skin testing.   Follow up with PCP/rheumatology regarding ANA testing and autoimmune concerns.

## 2023-01-03 ENCOUNTER — Encounter: Payer: Self-pay | Admitting: Allergy

## 2023-01-03 ENCOUNTER — Ambulatory Visit: Payer: 59 | Admitting: Allergy

## 2023-01-03 DIAGNOSIS — K219 Gastro-esophageal reflux disease without esophagitis: Secondary | ICD-10-CM

## 2023-01-03 DIAGNOSIS — J310 Chronic rhinitis: Secondary | ICD-10-CM

## 2023-01-03 DIAGNOSIS — K9049 Malabsorption due to intolerance, not elsewhere classified: Secondary | ICD-10-CM

## 2023-01-03 MED ORDER — RYALTRIS 665-25 MCG/ACT NA SUSP: 1.0000 | Freq: Two times a day (BID) | NASAL | 5 refills | Status: AC

## 2023-01-03 NOTE — Progress Notes (Signed)
Skin testing note  RE: Yvonne Little MRN: [id number] DOB: 1995/07/04 Date of Office Visit: 01/03/2023  Referring provider: Margarita Mail, DO Primary care provider: Margarita Mail, DO  Chief Complaint: allergy testing.  History of Present Illness: I had the pleasure of seeing Yvonne Little for a skin testing visit at the [facility] on 01/03/2023. She is a 27 y.o. female, who is being followed for allergic rhinoconjunctivitis, food intolerance, GERD, shortness of breath. Her previous allergy office visit was on 12/27/2022 with Dr. Selena Batten. Today is a skin testing visit.  She is accompanied today by her husband who provided/contributed to the history.   Discussed the use of AI scribe software for clinical note transcription with the patient, who gave verbal consent to proceed.  The patient presents with persistent nasal symptoms, including stuffiness and drainage, which have been causing significant discomfort and sleep disturbances. Despite trying various medications and nasal sprays, the patient reports no relief. The symptoms are severe enough to cause significant distress, with the patient describing recent nights as "really bad." The patient denies any known allergies and recent allergy testing did not reveal any triggers.  The patient has previously seen an ENT specialist, who suggested the possibility of needing surgery to alleviate the symptoms. The patient's symptoms and lack of response to medication suggest a non-allergic rhinitis or chronic rhinitis, possibly due to a structural issue rather than an allergies.     Assessment and Plan: Noam is a 27 y.o. female with: Chronic rhinitis Past history - Persistent symptoms of nasal congestion, postnasal drip, and anosmia despite previous treatment with antibiotics, steroids, and various nasal sprays. Eval done by ENT.  Interim history - No significant worsening of symptoms when off allergy medications,  suggesting nonallergic etiology. Today's skin testing negative to indoor/outdoor allergens.  Start Ryaltris (olopatadine + mometasone nasal spray combination) 1-2 sprays per nostril twice a day. Sample given. This replaces your other nasal sprays. If this works well for you, then have [facility] ship the medication to your home - prescription already sent in.  Nasal saline spray (i.e., Simply Saline) or nasal saline lavage (i.e., NeilMed) is recommended as needed and prior to medicated nasal sprays. Follow up with ENT regarding possible sinus surgery.   Gastroesophageal reflux disease, unspecified whether esophagitis present Continue lifestyle and dietary modifications. May take Protonix as needed.   Food intolerance Past history - Reports bloating, headaches, and severe fatigue after gluten consumption. Celiac disease has been ruled out. Continue gluten avoidance.  Return if symptoms worsen or fail to improve.  Meds ordered this encounter  Medications   Olopatadine-Mometasone (RYALTRIS) 665-25 MCG/ACT SUSP    Sig: Place 1-2 sprays into the nose in the morning and at bedtime.    Dispense:  29 g    Refill:  5    [phone]   Lab Orders  No laboratory test(s) ordered today    Diagnostics: Skin Testing: Environmental allergy panel. Today's skin testing negative to indoor/outdoor allergens.  Results discussed with patient/family.  Airborne Adult Perc - 01/03/23 1405     Time Antigen Placed 1405    Allergen Manufacturer Waynette Buttery    Location Back    Number of Test 55    1. Control-Buffer 50% Glycerol Negative    2. Control-Histamine 2+    3. Bahia Negative    4. French Southern Territories Negative    5. Johnson Negative    6. Kentucky Blue Negative    7. Meadow Fescue Negative    8.  Perennial Rye Negative    9. Timothy Negative    10. Ragweed Mix Negative    11. Cocklebur Negative    12. Plantain,  English Negative    13. Baccharis Negative    14. Dog Fennel Negative    15. Russian Thistle  Negative    16. Lamb's Quarters Negative    17. Sheep Sorrell Negative    18. Rough Pigweed Negative    19. Marsh Elder, Rough Negative    20. Mugwort, Common Negative    21. Box, Elder Negative    22. Cedar, red Negative    23. Sweet Gum Negative    24. Pecan Pollen Negative    25. Pine Mix Negative    26. Walnut, Black Pollen Negative    27. Red Mulberry Negative    28. Ash Mix Negative    29. Birch Mix Negative    30. Beech American Negative    31. Cottonwood, Guinea-Bissau Negative    32. Hickory, White Negative    33. Maple Mix Negative    34. Oak, Guinea-Bissau Mix Negative    35. Sycamore Eastern Negative    36. Alternaria Alternata Negative    37. Cladosporium Herbarum Negative    38. Aspergillus Mix Negative    39. Penicillium Mix Negative    40. Bipolaris Sorokiniana (Helminthosporium) Negative    41. Drechslera Spicifera (Curvularia) Negative    42. Mucor Plumbeus Negative    43. Fusarium Moniliforme Negative    44. Aureobasidium Pullulans (pullulara) Negative    45. Rhizopus Oryzae Negative    46. Botrytis Cinera Negative    47. Epicoccum Nigrum Negative    48. Phoma Betae Negative    49. Dust Mite Mix Negative    50. Cat Hair 10,000 BAU/ml Negative    51.  Dog Epithelia Negative    52. Mixed Feathers Negative    53. Horse Epithelia Negative    54. Cockroach, German Negative    55. Tobacco Leaf Negative             Intradermal - 01/03/23 1429     Time Antigen Placed 1430    Allergen Manufacturer Waynette Buttery    Location Arm    Number of Test 16    Control Negative    Bahia Negative    French Southern Territories Negative    Johnson Negative    7 Grass Negative    Ragweed Mix Negative    Weed Mix Negative    Tree Mix Negative    Mold 1 Negative    Mold 2 Negative    Mold 3 Negative    Mold 4 Negative    Mite Mix Negative    Cat Negative    Dog Negative    Cockroach Negative             Previous notes and tests were reviewed. The plan was reviewed with the  patient/family, and all questions/concerned were addressed.  It was my pleasure to see Yvonne Little today and participate in her care. Please feel free to contact me with any questions or concerns.  Sincerely,  Wyline Mood, DO Allergy & Immunology  [facility] office: [phone] [facility] office: [phone]

## 2023-01-03 NOTE — Patient Instructions (Addendum)
Today's skin testing negative to indoor/outdoor allergens.   Results given.  Start Ryaltris (olopatadine + mometasone nasal spray combination) 1-2 sprays per nostril twice a day. Sample given. This replaces your other nasal sprays. If this works well for you, then have [facility] ship the medication to your home - prescription already sent in.  Nasal saline spray (i.e., Simply Saline) or nasal saline lavage (i.e., NeilMed) is recommended as needed and prior to medicated nasal sprays. Follow up with ENT regarding possible sinus surgery.   Gluten intolerance Continue avoidance.  Reflux Continue lifestyle and dietary modifications. May take Protonix as needed.   Follow up with me only as needed.  Follow up with PCP/rheumatology regarding ANA testing and autoimmune concerns.

## 2023-04-17 ENCOUNTER — Other Ambulatory Visit: Payer: Self-pay

## 2023-04-17 ENCOUNTER — Encounter (HOSPITAL_BASED_OUTPATIENT_CLINIC_OR_DEPARTMENT_OTHER): Payer: Self-pay | Admitting: Otolaryngology

## 2023-04-18 NOTE — H&P (Signed)
 HPI:   Chief Complaint  Patient presents with  Sinusitis  Patient states she saw an Allergist and nothing was found, and referred back here.   Yvonne Little is a 28 y.o. female who presents as an established patient for chronic nasal congestion, sinus pressure and interference with sleep quality. Duration of nine months. She has used Flonase and Afrin in the past which exacerbated her symptoms. She reports a history of frequent sinus infection. Nasal endoscopy performed on 10/12/22 by Dr. Marene Little demonstrated mild septal spurring with thick mucoid discharge emanating from the right maxillary sinus. No polyps noted. She was treated at that time with antibiotics and steroids. Follow up sinus CT scan performed on 12/27/22 demonstrated: IMPRESSION: Bilateral inferior maxillary sinus mucosal thickening. Otherwise, sinuses are clear.  Patient underwent formal allergy testing with [facility] on 01/03/23. Skin testing was negative to indoor/outdoor allergens. Patient was started on Ryaltris nasal spray and advised to continue saline nasal spray. Patient today states that this new nasal spray has helped slightly more than the Flonase. Currently for the past 10 days she reports greenish nasal discharge from the right nostril and worsening sinus pressure. She is not using any topical nasal decongestant sprays.   Denies fever, headache, sore throat, dysphagia, cough or hoarseness.  Normal audiogram on 12/06/22 with type C tympanogram on the right.   No PMH of heart attack, stroke, diabetes, COPD/asthma, obstructive sleep apnea, bleeding disorder or adverse reaction to anesthesia.   Non-smoker.   PMH/Meds/All/SocHx/FamHx/ROS:   No past medical history on file.  No past surgical history on file.  No family history of bleeding disorders, wound healing problems or difficulty with anesthesia.     Current Outpatient Medications:  clonazePAM (KlonoPIN) 0.5 mg tablet, TAKE 1 TABLET TWICE  A DAY BY MOUTH AS NEEDED., Disp: , Rfl:  cloNIDine (CATAPRES) 0.1 mg tablet, TAKE 1 TABLET TWICE A DAY BY ORAL ROUTE AS NEEDED, FOR ANXIETY., Disp: , Rfl:  dextroamphetamine-amphetamine (ADDERALL XR) 15 mg 24 hr capsule, , Disp: , Rfl:  fluticasone propionate (FLONASE) 50 mcg/spray nasal spray, 2 sprays into each nostril once daily or 1 spray into each nostril twice daily., Disp: 16 g, Rfl: 11 lamoTRIgine (LaMICtal) 25 mg tablet, 25 mg., Disp: , Rfl:  mirtazapine (REMERON) 15 mg tablet, Take 7.5 mg by mouth nightly as needed., Disp: , Rfl:  olopatadine-mometasone (Ryaltris) [id number] mcg/spray spry, Administer 1-2 sprays into affected nostril(s)., Disp: , Rfl:  PARoxetine (PAXIL) 30 mg tablet, , Disp: , Rfl:  thyroid, pork, (ARMOUR THYROID) 90 mg tablet, Take 90 mg by mouth., Disp: , Rfl:  vilazodone (VIIBRYD) 40 mg tab, Take 1 tablet by mouth daily., Disp: , Rfl:  predniSONE (DELTASONE) 20 mg tablet, Take 3 daily for 3 days, take 2 daily for 3 days, take 1 daily for 3 days. (Patient not taking: Reported on 02/07/2023), Disp: 18 tablet, Rfl: 0  A complete ROS was performed with pertinent positives/negatives noted in the HPI. The remainder of the ROS are negative.   Physical Exam:   There were no vitals taken for this visit.  General Awake, at baseline alertness during examination.  Eyes No scleral icterus or conjunctival hemorrhage. Globe position appears normal. EOMI.  Right Ear EAC patent, TM intact w/o inflammation. Middle ear well aerated.  Left Ear EAC patent, TM intact w/o inflammation. Middle ear well aerated.  Nose Septum midline. Nasal mucosa is dry with minimal yellow crusting on the right inferior turbinate. Moderate inferior turbinate hypertrophy bilaterally. Patent, no  polyps or masses seen on anterior rhinoscopy.  Oral cavity No mucosal lesions or tumors seen. Tongue midline.  Oropharynx Symmetric tonsils.  Neck No abnormal cervical lymphadenopathy. No thyromegaly. No thyroid  masses palpated.  Cardio-vascular No cyanosis.  Pulmonary No audible stridor. Breathing easily with no labor.  Neuro Symmetric facial movement.  Psychiatry Appropriate affect and mood for clinic visit.   Independent Review of Additional Tests or Records:  Medical records, Radiology, Audiometry.   Procedures:  None  Impression & Plans:  Yvonne Little is a 28 y.o. female with history of chronic nasal obstruction and intermittent sinus infections. Formal allergy testing was negative. Currently with 10-day history of mucopurulent discharge. Recommend a course of Augmentin, 875mg  BID for 10 days. Recommend she take a probiotic supplement or eat yogurt with active cultures to prevent GI upset. Discussed surgical options (bilateral inferior turbinate reduction) as was previously considered with Dr. Marene Little. Patient elects to move forward with scheduling surgery. Since Dr. Marene Little will be out of the office for maternity leave next month, patient will return later this month for pre-op appointment with Dr. Pollyann Little.   She agrees with the plan.

## 2023-04-22 ENCOUNTER — Other Ambulatory Visit: Payer: Self-pay

## 2023-04-22 ENCOUNTER — Ambulatory Visit (HOSPITAL_BASED_OUTPATIENT_CLINIC_OR_DEPARTMENT_OTHER): Payer: No Typology Code available for payment source | Admitting: Anesthesiology

## 2023-04-22 ENCOUNTER — Encounter (HOSPITAL_BASED_OUTPATIENT_CLINIC_OR_DEPARTMENT_OTHER): Payer: Self-pay | Admitting: Otolaryngology

## 2023-04-22 ENCOUNTER — Encounter (HOSPITAL_BASED_OUTPATIENT_CLINIC_OR_DEPARTMENT_OTHER)
Admission: RE | Disposition: A | Discharge status: Home / Self Care | Payer: Self-pay | Source: Home / Self Care | Attending: Otolaryngology

## 2023-04-22 ENCOUNTER — Ambulatory Visit (HOSPITAL_BASED_OUTPATIENT_CLINIC_OR_DEPARTMENT_OTHER)
Admission: RE | Admit: 2023-04-22 | Discharge: 2023-04-22 | Disposition: A | Discharge status: Home / Self Care | Payer: No Typology Code available for payment source | Attending: Otolaryngology | Admitting: Otolaryngology

## 2023-04-22 DIAGNOSIS — J343 Hypertrophy of nasal turbinates: Secondary | ICD-10-CM | POA: Diagnosis present

## 2023-04-22 DIAGNOSIS — E039 Hypothyroidism, unspecified: Secondary | ICD-10-CM

## 2023-04-22 DIAGNOSIS — J45909 Unspecified asthma, uncomplicated: Secondary | ICD-10-CM | POA: Insufficient documentation

## 2023-04-22 DIAGNOSIS — Z01818 Encounter for other preprocedural examination: Secondary | ICD-10-CM

## 2023-04-22 HISTORY — DX: Gastro-esophageal reflux disease without esophagitis: K21.9

## 2023-04-22 HISTORY — DX: Other complications of anesthesia, initial encounter: T88.59XA

## 2023-04-22 HISTORY — DX: Hypertrophy of nasal turbinates: J34.3

## 2023-04-22 HISTORY — PX: TURBINATE REDUCTION: SHX6157

## 2023-04-22 HISTORY — DX: Attention-deficit hyperactivity disorder, unspecified type: F90.9

## 2023-04-22 SURGERY — REDUCTION, NASAL TURBINATE
Anesthesia: General | Site: Nose | Laterality: Bilateral

## 2023-04-22 MED ORDER — ACETAMINOPHEN 10 MG/ML IV SOLN
INTRAVENOUS | Status: AC
  Filled 2023-04-22: qty 100

## 2023-04-22 MED ORDER — LIDOCAINE HCL (CARDIAC) PF 100 MG/5ML IV SOSY
PREFILLED_SYRINGE | INTRAVENOUS | Status: DC | PRN
  Administered 2023-04-22: 60 mg via INTRAVENOUS

## 2023-04-22 MED ORDER — OXYMETAZOLINE HCL 0.05 % NA SOLN
NASAL | Status: AC
  Filled 2023-04-22: qty 30

## 2023-04-22 MED ORDER — AMISULPRIDE (ANTIEMETIC) 5 MG/2ML IV SOLN: 10.0000 mg | Freq: Once | INTRAVENOUS | Status: DC | PRN

## 2023-04-22 MED ORDER — BACITRACIN ZINC 500 UNIT/GM EX OINT
TOPICAL_OINTMENT | CUTANEOUS | Status: AC
  Filled 2023-04-22: qty 1.8

## 2023-04-22 MED ORDER — LIDOCAINE-EPINEPHRINE 1 %-1:100000 IJ SOLN
INTRAMUSCULAR | Status: AC
  Filled 2023-04-22: qty 1

## 2023-04-22 MED ORDER — PROPOFOL 10 MG/ML IV BOLUS
INTRAVENOUS | Status: DC | PRN
Start: 2023-04-22 — End: 2023-04-22
  Administered 2023-04-22: 300 mg via INTRAVENOUS

## 2023-04-22 MED ORDER — ACETAMINOPHEN 10 MG/ML IV SOLN
INTRAVENOUS | Status: DC | PRN
Start: 2023-04-22 — End: 2023-04-22
  Administered 2023-04-22: 1000 mg via INTRAVENOUS

## 2023-04-22 MED ORDER — BACITRACIN ZINC 500 UNIT/GM EX OINT
TOPICAL_OINTMENT | CUTANEOUS | Status: AC
  Filled 2023-04-22: qty 28.35

## 2023-04-22 MED ORDER — ONDANSETRON 4 MG PO TBDP: 4.0000 mg | ORAL_TABLET | Freq: Three times a day (TID) | ORAL | 0 refills | Status: DC | PRN

## 2023-04-22 MED ORDER — DEXAMETHASONE SODIUM PHOSPHATE 10 MG/ML IJ SOLN
INTRAMUSCULAR | Status: AC
  Filled 2023-04-22: qty 1

## 2023-04-22 MED ORDER — OXYMETAZOLINE HCL 0.05 % NA SOLN
NASAL | Status: DC | PRN
  Administered 2023-04-22: 2

## 2023-04-22 MED ORDER — ROCURONIUM BROMIDE 100 MG/10ML IV SOLN
INTRAVENOUS | Status: DC | PRN
  Administered 2023-04-22: 50 mg via INTRAVENOUS

## 2023-04-22 MED ORDER — BUPIVACAINE-EPINEPHRINE (PF) 0.25% -1:200000 IJ SOLN
INTRAMUSCULAR | Status: AC
  Filled 2023-04-22: qty 30

## 2023-04-22 MED ORDER — ONDANSETRON HCL 4 MG/2ML IJ SOLN
INTRAMUSCULAR | Status: AC
  Filled 2023-04-22: qty 2

## 2023-04-22 MED ORDER — FENTANYL CITRATE (PF) 100 MCG/2ML IJ SOLN
INTRAMUSCULAR | Status: AC
  Filled 2023-04-22: qty 2

## 2023-04-22 MED ORDER — SUGAMMADEX SODIUM 200 MG/2ML IV SOLN
INTRAVENOUS | Status: DC | PRN
  Administered 2023-04-22: 400 mg via INTRAVENOUS

## 2023-04-22 MED ORDER — DEXAMETHASONE SODIUM PHOSPHATE 4 MG/ML IJ SOLN
INTRAMUSCULAR | Status: DC | PRN
  Administered 2023-04-22: 10 mg via INTRAVENOUS

## 2023-04-22 MED ORDER — HYDROCODONE-ACETAMINOPHEN 5-325 MG PO TABS: 1.0000 | ORAL_TABLET | Freq: Four times a day (QID) | ORAL | 0 refills | Status: DC | PRN

## 2023-04-22 MED ORDER — DEXMEDETOMIDINE HCL IN NACL 80 MCG/20ML IV SOLN
INTRAVENOUS | Status: DC | PRN
  Administered 2023-04-22 (×2): 8 ug via INTRAVENOUS
  Administered 2023-04-22: 4 ug via INTRAVENOUS

## 2023-04-22 MED ORDER — LACTATED RINGERS IV SOLN: INTRAVENOUS | Status: DC

## 2023-04-22 MED ORDER — ACETAMINOPHEN 500 MG PO TABS: 1000.0000 mg | ORAL_TABLET | Freq: Once | ORAL | Status: DC

## 2023-04-22 MED ORDER — MIDAZOLAM HCL 2 MG/2ML IJ SOLN
INTRAMUSCULAR | Status: AC
  Filled 2023-04-22: qty 2

## 2023-04-22 MED ORDER — MIDAZOLAM HCL 5 MG/5ML IJ SOLN
INTRAMUSCULAR | Status: DC | PRN
  Administered 2023-04-22: 2 mg via INTRAVENOUS

## 2023-04-22 MED ORDER — ONDANSETRON HCL 4 MG/2ML IJ SOLN
INTRAMUSCULAR | Status: DC | PRN
  Administered 2023-04-22: 4 mg via INTRAVENOUS

## 2023-04-22 MED ORDER — OXYCODONE HCL 5 MG PO TABS
5.0000 mg | ORAL_TABLET | Freq: Once | ORAL | Status: AC
  Administered 2023-04-22: 5 mg via ORAL

## 2023-04-22 MED ORDER — BACITRACIN ZINC 500 UNIT/GM EX OINT
TOPICAL_OINTMENT | CUTANEOUS | Status: DC | PRN
  Administered 2023-04-22: 1 via TOPICAL

## 2023-04-22 MED ORDER — LIDOCAINE 2% (20 MG/ML) 5 ML SYRINGE
INTRAMUSCULAR | Status: AC
  Filled 2023-04-22: qty 5

## 2023-04-22 MED ORDER — FENTANYL CITRATE (PF) 100 MCG/2ML IJ SOLN
25.0000 ug | INTRAMUSCULAR | Status: DC | PRN
  Administered 2023-04-22: 50 ug via INTRAVENOUS

## 2023-04-22 MED ORDER — OXYMETAZOLINE HCL 0.05 % NA SOLN: 2.0000 | NASAL | Status: DC

## 2023-04-22 MED ORDER — FENTANYL CITRATE (PF) 100 MCG/2ML IJ SOLN
INTRAMUSCULAR | Status: DC | PRN
  Administered 2023-04-22 (×2): 50 ug via INTRAVENOUS

## 2023-04-22 MED ORDER — OXYCODONE HCL 5 MG PO TABS
ORAL_TABLET | ORAL | Status: AC
  Filled 2023-04-22: qty 1

## 2023-04-22 MED ORDER — LIDOCAINE-EPINEPHRINE 1 %-1:100000 IJ SOLN
INTRAMUSCULAR | Status: DC | PRN
  Administered 2023-04-22: 4 mL

## 2023-04-22 MED ORDER — PROPOFOL 10 MG/ML IV BOLUS
INTRAVENOUS | Status: AC
  Filled 2023-04-22: qty 20

## 2023-04-22 SURGICAL SUPPLY — 21 items
ATTRACTOMAT 16X20 MAGNETIC DRP (DRAPES) IMPLANT
CANISTER SUCT 1200ML W/VALVE (MISCELLANEOUS) ×1 IMPLANT
COAGULATOR SUCT 8FR VV (MISCELLANEOUS) IMPLANT
DRSG TELFA 3X8 NADH STRL (GAUZE/BANDAGES/DRESSINGS) ×1 IMPLANT
ELECT REM PT RETURN 9FT ADLT (ELECTROSURGICAL) IMPLANT
ELECTRODE REM PT RTRN 9FT ADLT (ELECTROSURGICAL) IMPLANT
GAUZE SPONGE 2X2 STRL 8-PLY (GAUZE/BANDAGES/DRESSINGS) ×1 IMPLANT
GLOVE ECLIPSE 7.5 STRL STRAW (GLOVE) ×1 IMPLANT
GOWN STRL REUS W/ TWL LRG LVL3 (GOWN DISPOSABLE) ×2 IMPLANT
NDL PRECISIONGLIDE 27X1.5 (NEEDLE) ×1 IMPLANT
NEEDLE PRECISIONGLIDE 27X1.5 (NEEDLE) ×1 IMPLANT
NS IRRIG 1000ML POUR BTL (IV SOLUTION) IMPLANT
PACK BASIN DAY SURGERY FS (CUSTOM PROCEDURE TRAY) ×1 IMPLANT
PACK ENT DAY SURGERY (CUSTOM PROCEDURE TRAY) ×1 IMPLANT
PATTIES SURGICAL .5 X3 (DISPOSABLE) ×1 IMPLANT
SHEET SILICONE 2X3 0.03 REINF (MISCELLANEOUS) IMPLANT
SPIKE FLUID TRANSFER (MISCELLANEOUS) IMPLANT
STRIP CLOSURE SKIN 1/2X4 (GAUZE/BANDAGES/DRESSINGS) IMPLANT
SUT ETHILON 3 0 PS 1 (SUTURE) IMPLANT
TOWEL GREEN STERILE FF (TOWEL DISPOSABLE) ×1 IMPLANT
YANKAUER SUCT BULB TIP NO VENT (SUCTIONS) ×1 IMPLANT

## 2023-04-22 NOTE — Transfer of Care (Signed)
 Immediate Anesthesia Transfer of Care Note  Patient: Yvonne Little  Procedure(s) Performed: Frederik Schmidt REDUCTION (Bilateral: Nose)  Patient Location: PACU  Anesthesia Type:General  Level of Consciousness: awake, alert , and oriented  Airway & Oxygen Therapy: Patient Spontanous Breathing and Patient connected to face mask oxygen  Post-op Assessment: Report given to RN and Post -op Vital signs reviewed and stable  Post vital signs: Reviewed and stable  Last Vitals:  Vitals Value Taken Time  BP 106/94 04/22/23 1005  Temp    Pulse 90 04/22/23 1008  Resp 16 04/22/23 1008  SpO2 99 % 04/22/23 1008  Vitals shown include unfiled device data.  Last Pain:  Vitals:   04/22/23 0850  TempSrc: Temporal  PainSc: 0-No pain      Patients Stated Pain Goal: 3 (04/22/23 0850)  Complications: No notable events documented.

## 2023-04-22 NOTE — Anesthesia Preprocedure Evaluation (Addendum)
 Anesthesia Evaluation  Patient identified by MRN, date of birth, ID band Patient awake    Reviewed: Allergy & Precautions, NPO status , Patient's Chart, lab work & pertinent test results  Airway Mallampati: II  TM Distance: >3 FB Neck ROM: Full    Dental  (+) Dental Advisory Given   Pulmonary neg pulmonary ROS   breath sounds clear to auscultation       Cardiovascular negative cardio ROS  Rhythm:Regular Rate:Normal     Neuro/Psych negative neurological ROS     GI/Hepatic Neg liver ROS,GERD  ,,  Endo/Other  Hypothyroidism    Renal/GU negative Renal ROS     Musculoskeletal   Abdominal   Peds  Hematology negative hematology ROS (+)   Anesthesia Other Findings   Reproductive/Obstetrics                             Anesthesia Physical Anesthesia Plan  ASA: 3  Anesthesia Plan: General   Post-op Pain Management: Ofirmev IV (intra-op)*, Toradol IV (intra-op)* and Precedex   Induction: Intravenous  PONV Risk Score and Plan: 3 and Dexamethasone, Ondansetron, Midazolam and Treatment may vary due to age or medical condition  Airway Management Planned: Oral ETT  Additional Equipment:   Intra-op Plan:   Post-operative Plan: Extubation in OR  Informed Consent: I have reviewed the patients History and Physical, chart, labs and discussed the procedure including the risks, benefits and alternatives for the proposed anesthesia with the patient or authorized representative who has indicated his/her understanding and acceptance.     Dental advisory given  Plan Discussed with: CRNA  Anesthesia Plan Comments: (Pt on cycle 2 days ago. Unable to provide urine sample. Okay to proceed.)       Anesthesia Quick Evaluation

## 2023-04-22 NOTE — Op Note (Signed)
 OPERATIVE REPORT  DATE OF SURGERY: 04/22/2023  PATIENT:  Yvonne Little,  28 y.o. female  PRE-OPERATIVE DIAGNOSIS:  Hypertrophy of inferior nasal turbinate  POST-OPERATIVE DIAGNOSIS:  Hypertrophy of inferior nasal turbinate  PROCEDURE:  Procedure(s): TURBINATE REDUCTION, bilateral  SURGEON:  Susy Frizzle, MD  ASSISTANTS: none  ANESTHESIA:   General   EBL: 30 ml  DRAINS: none  LOCAL MEDICATIONS USED:  1% Xylocaine with epinephrine  SPECIMEN:  none  COUNTS:  Correct  PROCEDURE DETAILS: The patient was taken to the operating room and placed on the operating table in the supine position. Following induction of general endotracheal anesthesia, the nose was prepped and draped in a standard fashion. Afrin spray was used preoperatively in the holding area. 1% Xylocaine with epinephrine was infiltrated into the  inferior turbinates bilaterally.   Submucous resection inferior turbinates bilaterally. The leading edge of the inferior turbinates were incised in a vertical fashion with a #15 scalpel. The Cottle elevator was used to elevate mucosa off bone in all directions. Fragments of the turbinate bone were resected with a Takahashi forceps. The turbinate remnants were outfractured with the Therapist, nutritional.  The turbinate bones were severely thickened and elongated.  This greatly increased the nasal airways bilaterally. The nasal cavities were packed with rolled up Telfa coated with bacitracin ointment. The pharynx was suctioned of blood and secretions under direct visualization. The patient was awakened extubated and transferred to recovery in stable condition.    PATIENT DISPOSITION:  To PACU, stable

## 2023-04-22 NOTE — Progress Notes (Signed)
 Spoke to Dr. Sampson Goon in regards to patient's inability to provide urine specimen for Pregnancy test. Pt states last menstrual cycle was 2 days ago. Dr. Sampson Goon states we can waive the pregnancy test. Pt is also in agreement.

## 2023-04-22 NOTE — Anesthesia Postprocedure Evaluation (Signed)
 Anesthesia Post Note  Patient: Yvonne Little  Procedure(s) Performed: Frederik Schmidt REDUCTION (Bilateral: Nose)     Patient location during evaluation: PACU Anesthesia Type: General Level of consciousness: awake and alert Pain management: pain level controlled Vital Signs Assessment: post-procedure vital signs reviewed and stable Respiratory status: spontaneous breathing, nonlabored ventilation, respiratory function stable and patient connected to nasal cannula oxygen Cardiovascular status: blood pressure returned to baseline and stable Postop Assessment: no apparent nausea or vomiting Anesthetic complications: no  No notable events documented.  Last Vitals:  Vitals:   04/22/23 1030 04/22/23 1045  BP: 121/72 128/78  Pulse: 92 80  Resp: 16 14  Temp:    SpO2: 97% 97%    Last Pain:  Vitals:   04/22/23 1030  TempSrc:   PainSc: 5                  Kennieth Rad

## 2023-04-22 NOTE — Anesthesia Procedure Notes (Signed)
 Procedure Name: Intubation Date/Time: 04/22/2023 9:36 AM  Performed by: Burna Cash, CRNAPre-anesthesia Checklist: Patient identified, Emergency Drugs available, Suction available and Patient being monitored Patient Re-evaluated:Patient Re-evaluated prior to induction Oxygen Delivery Method: Circle system utilized Preoxygenation: Pre-oxygenation with 100% oxygen Induction Type: IV induction Ventilation: Mask ventilation without difficulty Laryngoscope Size: Mac and 4 Grade View: Grade I Tube type: Oral Tube size: 7.0 mm Number of attempts: 1 Airway Equipment and Method: Stylet and Oral airway Placement Confirmation: ETT inserted through vocal cords under direct vision, positive ETCO2 and breath sounds checked- equal and bilateral Secured at: 21 cm Tube secured with: Tape Dental Injury: Teeth and Oropharynx as per pre-operative assessment

## 2023-04-22 NOTE — Discharge Instructions (Addendum)
 Call your surgeon if you experience:   1.  Fever over 101.0. 2.  Inability to urinate. 3.  Nausea and/or vomiting. 4.  Extreme swelling or bruising at the surgical site. 5.  Continued bleeding from the incision. 6.  Increased pain, redness or drainage from the incision. 7.  Problems related to your pain medication. 8.  Any problems and/or concerns  No Tylenol before 3:30pm if needed.  Post Anesthesia Home Care Instructions  Activity: Get plenty of rest for the remainder of the day. A responsible individual must stay with you for 24 hours following the procedure.  For the next 24 hours, DO NOT: -Drive a car -Advertising copywriter -Drink alcoholic beverages -Take any medication unless instructed by your physician -Make any legal decisions or sign important papers.  Meals: Start with liquid foods such as gelatin or soup. Progress to regular foods as tolerated. Avoid greasy, spicy, heavy foods. If nausea and/or vomiting occur, drink only clear liquids until the nausea and/or vomiting subsides. Call your physician if vomiting continues.  Special Instructions/Symptoms: Your throat may feel dry or sore from the anesthesia or the breathing tube placed in your throat during surgery. If this causes discomfort, gargle with warm salt water. The discomfort should disappear within 24 hours.

## 2023-04-22 NOTE — Interval H&P Note (Signed)
 History and Physical Interval Note:  04/22/2023 8:51 AM  Yvonne Little  has presented today for surgery, with the diagnosis of Hypertrophy of inferior nasal turbinate.  The various methods of treatment have been discussed with the patient and family. After consideration of risks, benefits and other options for treatment, the patient has consented to  Procedure(s): TURBINATE REDUCTION (Bilateral) as a surgical intervention.  The patient's history has been reviewed, patient examined, no change in status, stable for surgery.  I have reviewed the patient's chart and labs.  Questions were answered to the patient's satisfaction.     Serena Colonel

## 2023-04-23 ENCOUNTER — Encounter (HOSPITAL_BASED_OUTPATIENT_CLINIC_OR_DEPARTMENT_OTHER): Payer: Self-pay | Admitting: Otolaryngology

## 2023-05-23 ENCOUNTER — Other Ambulatory Visit: Payer: Self-pay

## 2023-05-23 ENCOUNTER — Encounter: Payer: Self-pay | Admitting: Internal Medicine

## 2023-05-23 ENCOUNTER — Ambulatory Visit (INDEPENDENT_AMBULATORY_CARE_PROVIDER_SITE_OTHER): Payer: 59 | Admitting: Internal Medicine

## 2023-05-23 VITALS — BP 126/82 | HR 68 | Resp 16 | Ht 68.0 in | Wt 287.9 lb

## 2023-05-23 DIAGNOSIS — E038 Other specified hypothyroidism: Secondary | ICD-10-CM

## 2023-05-23 DIAGNOSIS — E538 Deficiency of other specified B group vitamins: Secondary | ICD-10-CM | POA: Diagnosis not present

## 2023-05-23 DIAGNOSIS — F330 Major depressive disorder, recurrent, mild: Secondary | ICD-10-CM | POA: Diagnosis not present

## 2023-05-23 DIAGNOSIS — Z13220 Encounter for screening for lipoid disorders: Secondary | ICD-10-CM

## 2023-05-23 DIAGNOSIS — K219 Gastro-esophageal reflux disease without esophagitis: Secondary | ICD-10-CM

## 2023-05-23 DIAGNOSIS — E559 Vitamin D deficiency, unspecified: Secondary | ICD-10-CM | POA: Diagnosis not present

## 2023-05-23 DIAGNOSIS — R7309 Other abnormal glucose: Secondary | ICD-10-CM

## 2023-05-23 NOTE — Progress Notes (Signed)
 Established Patient Office Visit  Subjective   Patient ID: Yvonne Little, female    DOB: August 15, 1995  Age: 28 y.o. MRN: [id number]  Chief Complaint  Patient presents with   Medical Management of Chronic Issues    HPI  Patient is here for follow up on chronic medical conditions.   Subclinical Hypothyroidism: -Had been following with Endocrinology but was released because her levels were good off the medication  -TSH 3.486 11/24 -Had been on Armour Thyroid 90 mg daily but has not been taking since July  -No symptoms   GERD: -Occasionally exacerbated by the Paxil  -Mainly treated with lifestyle changes now but will take Protonix 40 mg occasionally as needed  MDD: -Mood status: controlled -Now following with Psychiatry  -Current treatment: Paxil 30 mg, Clonidine 0.2 mg at bedtime, Klonopin 0.5 mg PRN -No longer on Remeron 15 mg at night, caused weight gain -Satisfied with current treatment?: yes -Symptom severity: moderate  Previous psychiatric medications: Cymbalta caused severe anger and Lexapro had severe weakness after only 1 pill.      05/23/2023    3:30 PM 05/21/2022    2:37 PM 10/13/2021    3:17 PM 09/08/2021    9:32 AM 08/10/2021    2:29 PM  Depression screen PHQ 2/9  Decreased Interest 0 0 1 1 2   Down, Depressed, Hopeless 0 0 1 1 1   PHQ - 2 Score 0 0 2 2 3   Altered sleeping 0 0 3 0 3  Tired, decreased energy 0 0 3 0 2  Change in appetite 0 0 0 0 3  Feeling bad or failure about yourself  0 0 0 0 0  Trouble concentrating 0 0 3 0 1  Moving slowly or fidgety/restless 0 0 0 0 1  Suicidal thoughts 0 0 0 0 0  PHQ-9 Score 0 0 11 2 13   Difficult doing work/chores Not difficult at all Not difficult at all  Not difficult at all Very difficult   Health Maintenance: -Blood work due  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    Complication of anesthesia    pt reports panic attacks when coming out of anesthesia   Depression    Factor 5 Leiden mutation, heterozygous (HCC)    GERD (gastroesophageal reflux disease)    Nasal turbinate hypertrophy    Thyroid disease    hx of hypo   Past Surgical History:  Procedure Laterality Date   TURBINATE REDUCTION Bilateral 04/22/2023   Procedure: TURBINATE REDUCTION;  Surgeon: Serena Colonel, MD;  Location: [facility];  Service: ENT;  Laterality: Bilateral;   Social History   Tobacco Use   Smoking status: Never   Smokeless tobacco: Never  Vaping Use   Vaping status: Never Used  Substance Use Topics   Alcohol use: Yes    Comment: rare   Drug use: Never   Allergies  Allergen Reactions   Levothyroxine Sodium Rash   Duloxetine Hcl Other (See Comments)    Severe mood changes   Escitalopram Other (See Comments)    Altered mental status.    Levothyroxine Nausea Only   Lidocaine Hives and Rash    Review of Systems  All other systems reviewed and are negative.     Objective:     BP 126/82 (Cuff Size: Large)   Pulse 68   Resp 16   Ht  5\' 8"  (1.727 m)   Wt 287 lb 14.4 oz (130.6 kg)   SpO2 98%   BMI 43.78 kg/m  BP Readings from Last 3 Encounters:  05/23/23 126/82  04/22/23 (!) 141/81  12/27/22 112/66   Wt Readings from Last 3 Encounters:  05/23/23 287 lb 14.4 oz (130.6 kg)  04/22/23 294 lb 15.6 oz (133.8 kg)  12/27/22 275 lb 4 oz (124.9 kg)      Physical Exam Constitutional:      Appearance: Normal appearance.  HENT:     Head: Normocephalic and atraumatic.     Mouth/Throat:     Mouth: Mucous membranes are moist.     Pharynx: Oropharynx is clear.  Eyes:     Extraocular Movements: Extraocular movements intact.     Conjunctiva/sclera: Conjunctivae normal.     Pupils: Pupils are equal, round, and reactive to light.  Neck:     Comments: No thyromegaly Cardiovascular:     Rate and Rhythm: Normal rate and regular rhythm.  Pulmonary:      Effort: Pulmonary effort is normal.     Breath sounds: Normal breath sounds.  Musculoskeletal:     Cervical back: No tenderness.     Right lower leg: No edema.     Left lower leg: No edema.  Lymphadenopathy:     Cervical: No cervical adenopathy.  Skin:    General: Skin is warm and dry.  Neurological:     General: No focal deficit present.     Mental Status: She is alert. Mental status is at baseline.  Psychiatric:        Mood and Affect: Mood normal.        Behavior: Behavior normal.     Last CBC Lab Results  Component Value Date   WBC 8.2 05/21/2022   HGB 13.3 05/21/2022   HCT 39.3 05/21/2022   MCV 90.1 05/21/2022   MCH 30.5 05/21/2022   RDW 12.7 05/21/2022   PLT 303 05/21/2022   Last metabolic panel Lab Results  Component Value Date   GLUCOSE 93 05/21/2022   NA 141 05/21/2022   K 4.0 05/21/2022   CL 105 05/21/2022   CO2 26 05/21/2022   BUN 11 05/21/2022   CREATININE 0.83 05/21/2022   GFRNONAA >60 10/11/2020   CALCIUM 9.5 05/21/2022   PHOS 3.4 08/28/2020   PROT 7.2 05/21/2022   ALBUMIN 4.3 10/11/2020   BILITOT 0.5 05/21/2022   ALKPHOS 75 10/11/2020   AST 27 05/21/2022   ALT 32 (H) 05/21/2022   ANIONGAP 9 10/11/2020   Last lipids Lab Results  Component Value Date   CHOL 195 05/21/2022   HDL 44 (L) 05/21/2022   LDLCALC 122 (H) 05/21/2022   TRIG 169 (H) 05/21/2022   CHOLHDL 4.4 05/21/2022   Last hemoglobin A1c Lab Results  Component Value Date   HGBA1C 5.1 12/01/2018   Last thyroid functions Lab Results  Component Value Date   TSH 2.79 05/21/2022   T4TOTAL 4.9 (L) 05/21/2022   Last vitamin D No results found for: "25OHVITD2", "25OHVITD3", "VD25OH" Last vitamin B12 and Folate Lab Results  Component Value Date   VITAMINB12 192 08/25/2020   FOLATE 10.9 08/25/2020      The ASCVD Risk score (Arnett DK, et al., 2019) failed to calculate for the following reasons:   The 2019 ASCVD risk score is only valid for ages 18 to 32    Assessment  & Plan:   Subclinical hypothyroidism Assessment & Plan: Has been following with endocrinology, however  her labs have been normal without medication.  Will recheck labs today.  Orders: -     TSH -     Magnesium  Mild episode of recurrent major depressive disorder [facility]) Assessment & Plan: Following with psychiatry, doing much better.  She is currently on Paxil 30 mg, clonidine 0.2 mg at bedtime and Klonopin as needed.  Orders: -     CBC with Differential/Platelet -     COMPLETE METABOLIC PANEL WITHOUT GFR  Vitamin D deficiency Assessment & Plan: Recheck, not currently taking supplements.  Orders: -     VITAMIN D 25 Hydroxy (Vit-D Deficiency, Fractures)  Vitamin B12 deficiency Assessment & Plan: Recheck labs.  Orders: -     Vitamin B12  Gastroesophageal reflux disease, unspecified whether esophagitis present Assessment & Plan: Well-controlled with lifestyle changes, will occasionally need PPI as needed.   Lipid screening -     Lipid panel     Return in about 1 year (around 05/22/2024).    Margarita Mail, DO

## 2023-05-23 NOTE — Assessment & Plan Note (Signed)
 Well-controlled with lifestyle changes, will occasionally need PPI as needed.

## 2023-05-23 NOTE — Assessment & Plan Note (Signed)
 Recheck labs

## 2023-05-23 NOTE — Assessment & Plan Note (Signed)
 Has been following with endocrinology, however her labs have been normal without medication.  Will recheck labs today.

## 2023-05-23 NOTE — Assessment & Plan Note (Signed)
 Recheck, not currently taking supplements.

## 2023-05-23 NOTE — Assessment & Plan Note (Signed)
 Following with psychiatry, doing much better.  She is currently on Paxil 30 mg, clonidine 0.2 mg at bedtime and Klonopin as needed.

## 2023-05-24 LAB — COMPLETE METABOLIC PANEL WITHOUT GFR
AG Ratio: 1.8 (calc) (ref 1.0–2.5)
ALT: 32 U/L — ABNORMAL HIGH (ref 6–29)
AST: 22 U/L (ref 10–30)
Albumin: 4.7 g/dL (ref 3.6–5.1)
Alkaline phosphatase (APISO): 115 U/L (ref 31–125)
BUN: 15 mg/dL (ref 7–25)
CO2: 26 mmol/L (ref 20–32)
Calcium: 9.5 mg/dL (ref 8.6–10.2)
Chloride: 101 mmol/L (ref 98–110)
Creat: 0.85 mg/dL (ref 0.50–0.96)
Globulin: 2.6 g/dL (ref 1.9–3.7)
Glucose, Bld: 206 mg/dL — ABNORMAL HIGH (ref 65–99)
Potassium: 4.4 mmol/L (ref 3.5–5.3)
Sodium: 138 mmol/L (ref 135–146)
Total Bilirubin: 0.7 mg/dL (ref 0.2–1.2)
Total Protein: 7.3 g/dL (ref 6.1–8.1)

## 2023-05-24 LAB — CBC WITH DIFFERENTIAL/PLATELET
Absolute Lymphocytes: 2955 {cells}/uL (ref 850–3900)
Absolute Monocytes: 427 {cells}/uL (ref 200–950)
Basophils Absolute: 71 {cells}/uL (ref 0–200)
Basophils Relative: 0.9 %
Eosinophils Absolute: 332 {cells}/uL (ref 15–500)
Eosinophils Relative: 4.2 %
HCT: 41.7 % (ref 35.0–45.0)
Hemoglobin: 14.1 g/dL (ref 11.7–15.5)
MCH: 30.3 pg (ref 27.0–33.0)
MCHC: 33.8 g/dL (ref 32.0–36.0)
MCV: 89.7 fL (ref 80.0–100.0)
MPV: 9.9 fL (ref 7.5–12.5)
Monocytes Relative: 5.4 %
Neutro Abs: 4116 {cells}/uL (ref 1500–7800)
Neutrophils Relative %: 52.1 %
Platelets: 311 10*3/uL (ref 140–400)
RBC: 4.65 10*6/uL (ref 3.80–5.10)
RDW: 13.4 % (ref 11.0–15.0)
Total Lymphocyte: 37.4 %
WBC: 7.9 10*3/uL (ref 3.8–10.8)

## 2023-05-24 LAB — LIPID PANEL
Cholesterol: 246 mg/dL — ABNORMAL HIGH (ref <200)
HDL: 44 mg/dL — ABNORMAL LOW (ref >=50)
LDL Cholesterol (Calc): 160 mg/dL — ABNORMAL HIGH
Non-HDL Cholesterol (Calc): 202 mg/dL — ABNORMAL HIGH (ref <130)
Total CHOL/HDL Ratio: 5.6 (calc) — ABNORMAL HIGH (ref <5.0)
Triglycerides: 257 mg/dL — ABNORMAL HIGH (ref <150)

## 2023-05-24 LAB — HEMOGLOBIN A1C
Hgb A1c MFr Bld: 7.7 %{Hb} — ABNORMAL HIGH (ref <5.7)
Mean Plasma Glucose: 174 mg/dL
eAG (mmol/L): 9.7 mmol/L

## 2023-05-24 LAB — TEST AUTHORIZATION

## 2023-05-24 LAB — VITAMIN D 25 HYDROXY (VIT D DEFICIENCY, FRACTURES): Vit D, 25-Hydroxy: 23 ng/mL — ABNORMAL LOW (ref 30–100)

## 2023-05-24 LAB — TSH: TSH: 3.74 m[IU]/L

## 2023-05-24 LAB — MAGNESIUM: Magnesium: 1.8 mg/dL (ref 1.5–2.5)

## 2023-05-24 LAB — VITAMIN B12: Vitamin B-12: 428 pg/mL (ref 200–1100)

## 2023-05-24 NOTE — Addendum Note (Signed)
 Addended by: Margarita Mail on: 05/24/2023 08:04 AM   Modules accepted: Orders

## 2023-05-27 MED ORDER — VITAMIN D (ERGOCALCIFEROL) 1.25 MG (50000 UNIT) PO CAPS: 50000.0000 [IU] | ORAL_CAPSULE | ORAL | 0 refills | Status: AC

## 2023-05-27 NOTE — Addendum Note (Signed)
 Addended by: Margarita Mail on: 05/27/2023 11:32 AM   Modules accepted: Orders

## 2023-05-28 ENCOUNTER — Encounter: Payer: Self-pay | Admitting: Internal Medicine

## 2023-05-28 ENCOUNTER — Ambulatory Visit (INDEPENDENT_AMBULATORY_CARE_PROVIDER_SITE_OTHER): Admitting: Internal Medicine

## 2023-05-28 VITALS — BP 122/74 | HR 120 | Resp 18 | Ht 68.0 in | Wt 290.2 lb

## 2023-05-28 DIAGNOSIS — E1165 Type 2 diabetes mellitus with hyperglycemia: Secondary | ICD-10-CM | POA: Diagnosis not present

## 2023-05-28 LAB — POCT GLUCOSE (DEVICE FOR HOME USE): Glucose Fasting, POC: 173 mg/dL — AB (ref 70–99)

## 2023-05-28 MED ORDER — LANCET DEVICE MISC: 1.0000 | Freq: Three times a day (TID) | 0 refills | Status: AC

## 2023-05-28 MED ORDER — BLOOD GLUCOSE TEST VI STRP: 1.0000 | ORAL_STRIP | Freq: Three times a day (TID) | 0 refills | Status: DC

## 2023-05-28 MED ORDER — LANCETS MISC. MISC: 1.0000 | Freq: Three times a day (TID) | 0 refills | Status: AC

## 2023-05-28 MED ORDER — BLOOD GLUCOSE MONITORING SUPPL DEVI: 1.0000 | Freq: Three times a day (TID) | 0 refills | Status: AC

## 2023-05-28 NOTE — Patient Instructions (Signed)
 It was great seeing you today!  Plan discussed at today's visit: -Referral to Nutrition placed -Start checking blood sugar fasting and non-fasting (1-2 hours after largest meal) write it down and bring to follow up  Follow up in: 3 months   Take care and let us know if you have any questions or concerns prior to your next visit.  Dr. Caralee Ates  Diabetes Mellitus and Nutrition, Adult When you have diabetes, or diabetes mellitus, it is very important to have healthy eating habits because your blood sugar (glucose) levels are greatly affected by what you eat and drink. Eating healthy foods in the right amounts, at about the same times every day, can help you: Manage your blood glucose. Lower your risk of heart disease. Improve your blood pressure. Reach or maintain a healthy weight. What can affect my meal plan? Every person with diabetes is different, and each person has different needs for a meal plan. Your health care provider may recommend that you work with a dietitian to make a meal plan that is best for you. Your meal plan may vary depending on factors such as: The calories you need. The medicines you take. Your weight. Your blood glucose, blood pressure, and cholesterol levels. Your activity level. Other health conditions you have, such as heart or kidney disease. How do carbohydrates affect me? Carbohydrates, also called carbs, affect your blood glucose level more than any other type of food. Eating carbs raises the amount of glucose in your blood. It is important to know how many carbs you can safely have in each meal. This is different for every person. Your dietitian can help you calculate how many carbs you should have at each meal and for each snack. How does alcohol affect me? Alcohol can cause a decrease in blood glucose (hypoglycemia), especially if you use insulin or take certain diabetes medicines by mouth. Hypoglycemia can be a life-threatening condition. Symptoms of  hypoglycemia, such as sleepiness, dizziness, and confusion, are similar to symptoms of having too much alcohol. Do not drink alcohol if: Your health care provider tells you not to drink. You are pregnant, may be pregnant, or are planning to become pregnant. If you drink alcohol: Limit how much you have to: 0-1 drink a day for women. 0-2 drinks a day for men. Know how much alcohol is in your drink. In the [place] one drink equals one 12 oz bottle of beer (355 mL), one 5 oz glass of wine (148 mL), or one 1 oz glass of hard liquor (44 mL). Keep yourself hydrated with water, diet soda, or unsweetened iced tea. Keep in mind that regular soda, juice, and other mixers may contain a lot of sugar and must be counted as carbs. What are tips for following this plan?  Reading food labels Start by checking the serving size on the Nutrition Facts label of packaged foods and drinks. The number of calories and the amount of carbs, fats, and other nutrients listed on the label are based on one serving of the item. Many items contain more than one serving per package. Check the total grams (g) of carbs in one serving. Check the number of grams of saturated fats and trans fats in one serving. Choose foods that have a low amount or none of these fats. Check the number of milligrams (mg) of salt (sodium) in one serving. Most people should limit total sodium intake to less than 2,300 mg per day. Always check the nutrition information of foods labeled as "  low-fat" or "nonfat." These foods may be higher in added sugar or refined carbs and should be avoided. Talk to your dietitian to identify your daily goals for nutrients listed on the label. Shopping Avoid buying canned, pre-made, or processed foods. These foods tend to be high in fat, sodium, and added sugar. Shop around the outside edge of the grocery store. This is where you will most often find fresh fruits and vegetables, bulk grains, fresh meats, and fresh dairy  products. Cooking Use low-heat cooking methods, such as baking, instead of high-heat cooking methods, such as deep frying. Cook using healthy oils, such as olive, canola, or sunflower oil. Avoid cooking with butter, cream, or high-fat meats. Meal planning Eat meals and snacks regularly, preferably at the same times every day. Avoid going long periods of time without eating. Eat foods that are high in fiber, such as fresh fruits, vegetables, beans, and whole grains. Eat 4-6 oz (112-168 g) of lean protein each day, such as lean meat, chicken, fish, eggs, or tofu. One ounce (oz) (28 g) of lean protein is equal to: 1 oz (28 g) of meat, chicken, or fish. 1 egg.  cup (62 g) of tofu. Eat some foods each day that contain healthy fats, such as avocado, nuts, seeds, and fish. What foods should I eat? Fruits Berries. Apples. Oranges. Peaches. Apricots. Plums. Grapes. Mangoes. Papayas. Pomegranates. Kiwi. Cherries. Vegetables Leafy greens, including lettuce, spinach, kale, chard, collard greens, mustard greens, and cabbage. Beets. Cauliflower. Broccoli. Carrots. Green beans. Tomatoes. Peppers. Onions. Cucumbers. Brussels sprouts. Grains Whole grains, such as whole-wheat or whole-grain bread, crackers, tortillas, cereal, and pasta. Unsweetened oatmeal. Quinoa. Brown or wild rice. Meats and other proteins Seafood. Poultry without skin. Lean cuts of poultry and beef. Tofu. Nuts. Seeds. Dairy Low-fat or fat-free dairy products such as milk, yogurt, and cheese. The items listed above may not be a complete list of foods and beverages you can eat and drink. Contact a dietitian for more information. What foods should I avoid? Fruits Fruits canned with syrup. Vegetables Canned vegetables. Frozen vegetables with butter or cream sauce. Grains Refined white flour and flour products such as bread, pasta, snack foods, and cereals. Avoid all processed foods. Meats and other proteins Fatty cuts of meat.  Poultry with skin. Breaded or fried meats. Processed meat. Avoid saturated fats. Dairy Full-fat yogurt, cheese, or milk. Beverages Sweetened drinks, such as soda or iced tea. The items listed above may not be a complete list of foods and beverages you should avoid. Contact a dietitian for more information. Questions to ask a health care provider Do I need to meet with a certified diabetes care and education specialist? Do I need to meet with a dietitian? What number can I call if I have questions? When are the best times to check my blood glucose? Where to find more information: American Diabetes Association: diabetes.org Academy of Nutrition and Dietetics: eatright.[employer] of Diabetes and Digestive and Kidney Diseases: StageSync.si Association of Diabetes Care & Education Specialists: diabeteseducator.org Summary It is important to have healthy eating habits because your blood sugar (glucose) levels are greatly affected by what you eat and drink. It is important to use alcohol carefully. A healthy meal plan will help you manage your blood glucose and lower your risk of heart disease. Your health care provider may recommend that you work with a dietitian to make a meal plan that is best for you. This information is not intended to replace advice given to you by  your health care provider. Make sure you discuss any questions you have with your health care provider. Document Revised: 09/15/2019 Document Reviewed: 09/16/2019 Elsevier Patient Education  2024 [employer].

## 2023-05-28 NOTE — Progress Notes (Signed)
 Established Patient Office Visit  Subjective   Patient ID: Yvonne Little, female    DOB: 1995-12-24  Age: 28 y.o. MRN: [id number]  Chief Complaint  Patient presents with   Medical Management of Chronic Issues    HPI  Patient is here for follow up on recent labs and new onset diabetes.   Diabetes, Type 2: -Last A1c 3/25 7.7% -Medications: Nothing - patient has a strong family history of diabetes, father and uncle  -Checking BG at home: No -Diet: Had been working to lose weight, gained weight when on Remeron. Does have symptoms of hypoglycemia occasionally, will feel tried and dizzy -Eye exam: Discuss at follow up -Foot exam: Discuss at follow up -Microalbumin: Discuss at follow up -Statin: no -PNA vaccine: Discuss at follow up -Denies symptoms of hypoglycemia, polyuria, polydipsia, numbness extremities, foot ulcers/trauma.    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   Complication of anesthesia    pt reports panic attacks when coming out of anesthesia   Depression    Factor 5 Leiden mutation, heterozygous (HCC)    GERD (gastroesophageal reflux disease)    Nasal turbinate hypertrophy    Thyroid disease    hx of hypo   Past Surgical History:  Procedure Laterality Date   TURBINATE REDUCTION Bilateral 04/22/2023   Procedure: TURBINATE REDUCTION;  Surgeon: Serena Colonel, MD;  Location: [facility];  Service: ENT;  Laterality: Bilateral;   Social History   Tobacco Use   Smoking status: Never   Smokeless tobacco: Never  Vaping Use   Vaping status: Never Used  Substance Use Topics   Alcohol use: Yes    Comment: rare   Drug use: Never   Allergies  Allergen Reactions   Levothyroxine Sodium Rash   Duloxetine Hcl Other (See Comments)    Severe mood changes   Escitalopram Other (See Comments)    Altered mental  status.    Levothyroxine Nausea Only   Lidocaine Hives and Rash    Review of Systems  All other systems reviewed and are negative.     Objective:     BP 122/74   Pulse (!) 120   Resp 18   Ht 5\' 8"  (1.727 m)   Wt 290 lb 3.2 oz (131.6 kg)   LMP 05/13/2023   BMI 44.12 kg/m  BP Readings from Last 3 Encounters:  05/28/23 122/74  05/23/23 126/82  04/22/23 (!) 141/81   Wt Readings from Last 3 Encounters:  05/28/23 290 lb 3.2 oz (131.6 kg)  05/23/23 287 lb 14.4 oz (130.6 kg)  04/22/23 294 lb 15.6 oz (133.8 kg)      Physical Exam Constitutional:      Appearance: Normal appearance.  HENT:     Head: Normocephalic and atraumatic.  Eyes:     Conjunctiva/sclera: Conjunctivae normal.  Cardiovascular:     Rate and Rhythm: Normal rate and regular rhythm.  Pulmonary:     Effort: Pulmonary effort is normal.     Breath sounds: Normal breath sounds.  Skin:    General: Skin is warm and dry.  Neurological:     General: No focal deficit present.     Mental Status: She is alert. Mental status is at baseline.  Psychiatric:        Mood and Affect: Mood normal.        Behavior: Behavior normal.  Last CBC Lab Results  Component Value Date   WBC 7.9 05/23/2023   HGB 14.1 05/23/2023   HCT 41.7 05/23/2023   MCV 89.7 05/23/2023   MCH 30.3 05/23/2023   RDW 13.4 05/23/2023   PLT 311 05/23/2023   Last metabolic panel Lab Results  Component Value Date   GLUCOSE 206 (H) 05/23/2023   NA 138 05/23/2023   K 4.4 05/23/2023   CL 101 05/23/2023   CO2 26 05/23/2023   BUN 15 05/23/2023   CREATININE 0.85 05/23/2023   GFRNONAA >60 10/11/2020   CALCIUM 9.5 05/23/2023   PHOS 3.4 08/28/2020   PROT 7.3 05/23/2023   ALBUMIN 4.3 10/11/2020   BILITOT 0.7 05/23/2023   ALKPHOS 75 10/11/2020   AST 22 05/23/2023   ALT 32 (H) 05/23/2023   ANIONGAP 9 10/11/2020   Last lipids Lab Results  Component Value Date   CHOL 246 (H) 05/23/2023   HDL 44 (L) 05/23/2023   LDLCALC 160 (H)  05/23/2023   TRIG 257 (H) 05/23/2023   CHOLHDL 5.6 (H) 05/23/2023   Last hemoglobin A1c Lab Results  Component Value Date   HGBA1C 7.7 (H) 05/23/2023   Last thyroid functions Lab Results  Component Value Date   TSH 3.74 05/23/2023   T4TOTAL 4.9 (L) 05/21/2022   Last vitamin D Lab Results  Component Value Date   VD25OH 23 (L) 05/23/2023   Last vitamin B12 and Folate Lab Results  Component Value Date   VITAMINB12 428 05/23/2023   FOLATE 10.9 08/25/2020      The ASCVD Risk score (Arnett DK, et al., 2019) failed to calculate for the following reasons:   The 2019 ASCVD risk score is only valid for ages 86 to 72    Assessment & Plan:   Type 2 diabetes mellitus with hyperglycemia, without long-term current use of insulin (HCC) Assessment & Plan: Discussed recent labs in depth, etiology and pathology of diabetes in depth. Discussed nutrition recommendations, will place referral to diabetes education and nutrition. Plan to work on lifestyle changes, will prescribe glucose monitoring kit and she will check fasting and non-fasting blood sugar, write down and return in 3 months to recheck.   Orders: -     Referral to Nutrition and Diabetes Services -     Blood Glucose Monitoring Suppl; 1 each by Does not apply route in the morning, at noon, and at bedtime. May substitute to any manufacturer covered by patient's insurance.  Dispense: 1 each; Refill: 0 -     Blood Glucose Test; 1 each by In Vitro route in the morning, at noon, and at bedtime. May substitute to any manufacturer covered by patient's insurance.  Dispense: 100 strip; Refill: 0 -     Lancet Device; 1 each by Does not apply route in the morning, at noon, and at bedtime. May substitute to any manufacturer covered by patient's insurance.  Dispense: 1 each; Refill: 0 -     Lancets Misc.; 1 each by Does not apply route in the morning, at noon, and at bedtime. May substitute to any manufacturer covered by patient's insurance.   Dispense: 100 each; Refill: 0 -     POCT Glucose (Device for Home Use)     Return in about 3 months (around 08/27/2023) for follow up on a1c.    Margarita Mail, DO

## 2023-05-28 NOTE — Assessment & Plan Note (Signed)
 Discussed recent labs in depth, etiology and pathology of diabetes in depth. Discussed nutrition recommendations, will place referral to diabetes education and nutrition. Plan to work on lifestyle changes, will prescribe glucose monitoring kit and she will check fasting and non-fasting blood sugar, write down and return in 3 months to recheck.

## 2023-05-30 ENCOUNTER — Telehealth: Payer: Self-pay

## 2023-05-30 NOTE — Telephone Encounter (Signed)
 Copied from CRM [id number]. Topic: Clinical - Prescription Issue >> May 29, 2023  4:51 PM Shelah Lewandowsky wrote: Reason for CRM: Blood Glucose Monitoring Suppl DEVI- [facility] device- please call patient with any [phone]- [facility]

## 2023-05-30 NOTE — Telephone Encounter (Signed)
 Left message for patient that Glucose device and supplies was sent to [facility] [place]. She can call and have them transfer everything [facility] the [facility] or call office back and we can resend

## 2023-06-16 ENCOUNTER — Other Ambulatory Visit: Payer: Self-pay | Admitting: Internal Medicine

## 2023-06-16 DIAGNOSIS — E1165 Type 2 diabetes mellitus with hyperglycemia: Secondary | ICD-10-CM

## 2023-06-17 ENCOUNTER — Other Ambulatory Visit (HOSPITAL_COMMUNITY): Payer: Self-pay

## 2023-06-17 ENCOUNTER — Encounter: Payer: Self-pay | Admitting: Internal Medicine

## 2023-06-17 DIAGNOSIS — E1165 Type 2 diabetes mellitus with hyperglycemia: Secondary | ICD-10-CM

## 2023-06-17 MED ORDER — BLOOD GLUCOSE TEST VI STRP
1.0000 | ORAL_STRIP | Freq: Three times a day (TID) | 0 refills | Status: DC
  Filled 2023-06-17: qty 100, 30d supply, fill #0

## 2023-06-18 ENCOUNTER — Other Ambulatory Visit: Payer: Self-pay

## 2023-06-18 MED ORDER — BLOOD GLUCOSE TEST VI STRP: 1.0000 | ORAL_STRIP | Freq: Three times a day (TID) | 0 refills | Status: DC

## 2023-07-22 ENCOUNTER — Other Ambulatory Visit: Payer: Self-pay | Admitting: Internal Medicine

## 2023-07-22 DIAGNOSIS — E1165 Type 2 diabetes mellitus with hyperglycemia: Secondary | ICD-10-CM

## 2023-07-24 NOTE — Telephone Encounter (Signed)
 Requested medication (s) are due for refill today: expired date 07/18/23  Requested medication (s) are on the active medication list: no   Last refill:  na   Future visit scheduled: no   Notes to clinic:  expired medication 07/18/23. Do you want to renew Rx?     Requested Prescriptions  Pending Prescriptions Disp Refills   ACCU-CHEK GUIDE TEST test strip [Pharmacy Med Name: ACCU-CHEK GUIDE TEST STRIP] 100 strip 0    Sig: TEST MORNING, NOON, AND AT BEDTIME AS DIRECTED     There is no refill protocol information for this order

## 2023-07-28 ENCOUNTER — Other Ambulatory Visit: Payer: Self-pay | Admitting: Internal Medicine

## 2023-07-28 DIAGNOSIS — E559 Vitamin D deficiency, unspecified: Secondary | ICD-10-CM

## 2023-07-29 NOTE — Telephone Encounter (Signed)
 Requested medication (s) are due for refill today: yes  Requested medication (s) are on the active medication list: yes  Last refill:  05/27/23  Future visit scheduled: yes  Notes to clinic:  Manual Review: Route requests for 50,000 IU strength to the provider      Requested Prescriptions  Pending Prescriptions Disp Refills   Vitamin D , Ergocalciferol , (DRISDOL ) 1.25 MG (50000 UNIT) CAPS capsule [Pharmacy Med Name: Vitamin D  (Ergocalciferol ) 1.25 MG (50000 UT) Oral Capsule] 12 capsule 3    Sig: TAKE 1 CAPSULE BY MOUTH EVERY 7  DAYS     Endocrinology:  Vitamins - Vitamin D  Supplementation 2 Failed - 07/29/2023  4:09 PM      Failed - Manual Review: Route requests for 50,000 IU strength to the provider      Failed - Vitamin D  in normal range and within 360 days    Vit D, 25-Hydroxy  Date Value Ref Range Status  05/23/2023 23 (L) 30 - 100 ng/mL Final    Comment:    Vitamin D  Status         25-OH Vitamin D : . Deficiency:                    <20 ng/mL Insufficiency:             20 - 29 ng/mL Optimal:                 > or = 30 ng/mL . For 25-OH Vitamin D  testing on patients on  D2-supplementation and patients for whom quantitation  of D2 and D3 fractions is required, the QuestAssureD(TM) 25-OH VIT D, (D2,D3), LC/MS/MS is recommended: order  code 04540 (patients >76yrs). . See Note 1 . Note 1 . For additional information, please refer to  http://education.Quest[facility].com/faq/FAQ199  (This link is being provided for informational/ educational purposes only.)          Passed - Ca in normal range and within 360 days    Calcium  Date Value Ref Range Status  05/23/2023 9.5 8.6 - 10.2 mg/dL Final         Passed - Valid encounter within last 12 months    Recent Outpatient Visits           2 months ago Type 2 diabetes mellitus with hyperglycemia, without long-term current use of insulin [facility])   [facility] Rock[place] Cid, [place]   2 months ago  Subclinical hypothyroidism   [facility] [place] Cid, [place]

## 2023-07-30 ENCOUNTER — Other Ambulatory Visit: Payer: Self-pay | Admitting: Internal Medicine

## 2023-07-31 NOTE — Telephone Encounter (Signed)
 Requested medication (s) are due for refill today: na  Requested medication (s) are on the active medication list: no  Last refill:  na  Future visit scheduled: no  Notes to clinic:  requesting lancets and not on medication list. Please order.     Requested Prescriptions  Pending Prescriptions Disp Refills   Accu-Chek Softclix Lancets lancets [Pharmacy Med Name: ACCU-CHEK SOFTCLIX LANCETS] 100 each     Sig: USE IN THE [facility] AND AT BEDTIME     Endocrinology: Diabetes - Testing Supplies Passed - 07/31/2023  4:20 PM      Passed - Valid encounter within last 12 months    Recent Outpatient Visits           2 months ago Type 2 diabetes mellitus with hyperglycemia, without long-term current use of insulin [facility])   [place] [facility] Rockney Cid, DO   2 months ago Subclinical hypothyroidism   [facility] Rock[place] Cid, [place]

## 2023-08-28 ENCOUNTER — Ambulatory Visit: Admitting: Internal Medicine

## 2023-10-29 ENCOUNTER — Ambulatory Visit: Admitting: Internal Medicine

## 2023-12-23 ENCOUNTER — Other Ambulatory Visit: Payer: Self-pay | Admitting: Internal Medicine

## 2023-12-23 DIAGNOSIS — E1165 Type 2 diabetes mellitus with hyperglycemia: Secondary | ICD-10-CM

## 2023-12-24 NOTE — Telephone Encounter (Signed)
 Requested medication (s) are due for refill today: Yes  Requested medication (s) are on the active medication list: Yes  Last refill:  07/24/23  Future visit scheduled: Yes  Notes to clinic:  Unable to refill due to no refill protocol for this medication.      Requested Prescriptions  Pending Prescriptions Disp Refills   ACCU-CHEK GUIDE TEST test strip [Pharmacy Med Name: ACCU-CHEK GUIDE TEST STRIP] 100 strip 0    Sig: TEST MORNING, NOON, AND AT BEDTIME AS DIRECTED     There is no refill protocol information for this order

## 2024-03-10 ENCOUNTER — Ambulatory Visit: Admitting: Internal Medicine

## 2024-05-25 ENCOUNTER — Ambulatory Visit: Admitting: Internal Medicine
